# Patient Record
Sex: Female | Born: 1990 | Race: White | Hispanic: No | Marital: Single | Smoking: Former smoker
Health system: Southern US, Community
[De-identification: ages and names within clinical notes are randomized; demographics above are authoritative.]

## PROBLEM LIST (undated history)

## (undated) DIAGNOSIS — K219 Gastro-esophageal reflux disease without esophagitis: Secondary | ICD-10-CM

## (undated) DIAGNOSIS — K589 Irritable bowel syndrome without diarrhea: Secondary | ICD-10-CM

## (undated) DIAGNOSIS — T7840XA Allergy, unspecified, initial encounter: Secondary | ICD-10-CM

## (undated) DIAGNOSIS — B977 Papillomavirus as the cause of diseases classified elsewhere: Secondary | ICD-10-CM

## (undated) DIAGNOSIS — F1910 Other psychoactive substance abuse, uncomplicated: Secondary | ICD-10-CM

## (undated) DIAGNOSIS — G5600 Carpal tunnel syndrome, unspecified upper limb: Secondary | ICD-10-CM

## (undated) DIAGNOSIS — F329 Major depressive disorder, single episode, unspecified: Secondary | ICD-10-CM

## (undated) DIAGNOSIS — F32A Depression, unspecified: Secondary | ICD-10-CM

## (undated) DIAGNOSIS — Z9889 Other specified postprocedural states: Secondary | ICD-10-CM

## (undated) DIAGNOSIS — D649 Anemia, unspecified: Secondary | ICD-10-CM

## (undated) DIAGNOSIS — R112 Nausea with vomiting, unspecified: Secondary | ICD-10-CM

## (undated) DIAGNOSIS — F419 Anxiety disorder, unspecified: Secondary | ICD-10-CM

## (undated) HISTORY — DX: Allergy, unspecified, initial encounter: T78.40XA

## (undated) HISTORY — DX: Other psychoactive substance abuse, uncomplicated: F19.10

## (undated) HISTORY — DX: Gastro-esophageal reflux disease without esophagitis: K21.9

## (undated) HISTORY — PX: TONSILLECTOMY: SUR1361

---
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
## 2009-06-10 ENCOUNTER — Encounter: Admission: RE | Admit: 2009-06-10 | Discharge: 2009-09-03 | Payer: Self-pay | Admitting: Orthopedic Surgery

## 2009-08-31 ENCOUNTER — Encounter: Admission: RE | Admit: 2009-08-31 | Discharge: 2009-08-31 | Payer: Self-pay | Admitting: Family Medicine

## 2013-02-28 ENCOUNTER — Ambulatory Visit (INDEPENDENT_AMBULATORY_CARE_PROVIDER_SITE_OTHER): Payer: Managed Care, Other (non HMO) | Admitting: Physician Assistant

## 2013-02-28 VITALS — BP 120/70 | HR 76 | Temp 97.8°F | Ht 63.0 in | Wt 140.0 lb

## 2013-02-28 DIAGNOSIS — J322 Chronic ethmoidal sinusitis: Secondary | ICD-10-CM

## 2013-02-28 MED ORDER — AMOXICILLIN 500 MG PO CAPS: 500.0000 mg | ORAL_CAPSULE | Freq: Three times a day (TID) | ORAL | Status: DC

## 2013-03-01 NOTE — Progress Notes (Signed)
  Subjective:    Patient ID: Jean Campbell, female    DOB: 1991-05-12, 22 y.o.   MRN: [id number]  HPI Sinus pain and pressure x 2 days    Review of Systems  Constitutional: Positive for chills.  HENT: Positive for congestion, rhinorrhea, sneezing, postnasal drip and sinus pressure.   All other systems reviewed and are negative.       Objective:   Physical Exam  Vitals reviewed. Constitutional: She appears well-developed and well-nourished.  HENT:  Head: Normocephalic and atraumatic.  Right Ear: External ear normal.  Left Ear: External ear normal.  maxofacial tenderness, nasal hypertrophy  Eyes: Conjunctivae and EOM are normal. Pupils are equal, round, and reactive to light.  Neck: Normal range of motion. Neck supple.  Cardiovascular: Normal rate, regular rhythm and normal heart sounds.   Pulmonary/Chest: Effort normal and breath sounds normal.          Assessment & Plan:  Ethmoid sinusitis - Plan: amoxicillin (AMOXIL) 500 MG capsule

## 2013-04-02 ENCOUNTER — Encounter: Payer: Self-pay | Admitting: Family Medicine

## 2013-04-02 ENCOUNTER — Ambulatory Visit (INDEPENDENT_AMBULATORY_CARE_PROVIDER_SITE_OTHER): Payer: Managed Care, Other (non HMO) | Admitting: Family Medicine

## 2013-04-02 VITALS — BP 111/61 | HR 67 | Temp 97.0°F | Ht 63.0 in | Wt 156.0 lb

## 2013-04-02 DIAGNOSIS — L259 Unspecified contact dermatitis, unspecified cause: Secondary | ICD-10-CM

## 2013-04-02 MED ORDER — METHYLPREDNISOLONE ACETATE 80 MG/ML IJ SUSP
60.0000 mg | Freq: Once | INTRAMUSCULAR | Status: AC
  Administered 2013-04-02: 60 mg via INTRAMUSCULAR

## 2013-04-02 MED ORDER — PREDNISONE 10 MG PO TABS: ORAL_TABLET | ORAL | Status: DC

## 2013-04-02 NOTE — Progress Notes (Signed)
Subjective:    Patient ID: Jean Campbell, female    DOB: [birth year], 22 y.o.   MRN: [id number]  HPI Working in yard 3 days ago. Has developed contact dermatitis involving the face arms legs and abdomen. Says that she is very allergic to this and seems to spread rapidly.   Review of Systems  Skin: Positive for rash (itchy ,face, arms, legs, stomach).       Objective:   Physical Exam Positive for rash and multiple areas of her body right neck right arm right leg breast area and abdomen. Also has rash anterior to the left ear.      Assessment & Plan:  1. Contact dermatitis - methylPREDNISolone acetate (DEPO-MEDROL) injection 60 mg; Inject 0.75 mLs (60 mg total) into the muscle once. - predniSONE (DELTASONE) 10 MG tablet; 1 tablet 4 times daily for 3 day 1 tablet 3 times daily for 3 day 1 tablet 2 times daily for 3 day 1 tablet daily for 3 day  Dispense: 30 tablet; Refill: 0   Patient Instructions  Use Benadryl as needed for itching especially at nighttime Take meds as directed       Poison [facility] is an inflammation of the skin (contact dermatitis). It is caused by contact with the allergens on the leaves of the oak (toxicodendron) plants. Depending on your sensitivity, the rash may consist simply of redness and itching, or it may also progress to blisters which may break open (rupture). These must be well cared for to prevent secondary germ (bacterial) infection as these infections can lead to scarring. The eyes may also get puffy. The puffiness is worst in the morning and gets better as the day progresses. Healing is best accomplished by keeping any open areas dry, clean, covered with a bandage, and covered with an antibacterial ointment if needed. Without secondary infection, this dermatitis usually heals without scarring within 2 to 3 weeks without treatment. HOME CARE INSTRUCTIONS When you have been exposed to poison oak, it is very important to thoroughly wash with  soap and water as soon as the exposure has been discovered. You have about one half hour to remove the plant resin before it will cause the rash. This cleaning will quickly destroy the oil or antigen on the skin (the antigen is what causes the rash). Wash aggressively under the fingernails as any plant resin still there will continue to spread the rash. Do not rub skin vigorously when washing affected area. Poison oak cannot spread if no oil from the plant remains on your body. Rash that has progressed to weeping sores (lesions) will not spread the rash unless you have not washed thoroughly. It is also important to clean any clothes you have been wearing as they may carry active allergens which will spread the rash, even several days later. Avoidance of the plant in the future is the best measure. Poison oak plants can be recognized by the number of leaves. Generally, poison oak has three leaves with flowering branches on a single stem. Diphenhydramine may be purchased over the counter and used as needed for itching. Do not drive with this medication if it makes you drowsy. Ask your caregiver about medication for children. SEEK IMMEDIATE MEDICAL CARE IF:   Open areas of the rash develop.  You notice redness extending beyond the area of the rash.  There is a pus like discharge.  There is increased pain.  Other signs of infection develop (such as fever). Document Released: 05/28/2003 Document Revised:  02/13/2012 Document Reviewed: 10/07/2009 ExitCare Patient Information 2013 [place], [place].

## 2013-04-02 NOTE — Patient Instructions (Addendum)
Use Benadryl as needed for itching especially at nighttime Take meds as directed       Poison [facility] is an inflammation of the skin (contact dermatitis). It is caused by contact with the allergens on the leaves of the oak (toxicodendron) plants. Depending on your sensitivity, the rash may consist simply of redness and itching, or it may also progress to blisters which may break open (rupture). These must be well cared for to prevent secondary germ (bacterial) infection as these infections can lead to scarring. The eyes may also get puffy. The puffiness is worst in the morning and gets better as the day progresses. Healing is best accomplished by keeping any open areas dry, clean, covered with a bandage, and covered with an antibacterial ointment if needed. Without secondary infection, this dermatitis usually heals without scarring within 2 to 3 weeks without treatment. HOME CARE INSTRUCTIONS When you have been exposed to poison oak, it is very important to thoroughly wash with soap and water as soon as the exposure has been discovered. You have about one half hour to remove the plant resin before it will cause the rash. This cleaning will quickly destroy the oil or antigen on the skin (the antigen is what causes the rash). Wash aggressively under the fingernails as any plant resin still there will continue to spread the rash. Do not rub skin vigorously when washing affected area. Poison oak cannot spread if no oil from the plant remains on your body. Rash that has progressed to weeping sores (lesions) will not spread the rash unless you have not washed thoroughly. It is also important to clean any clothes you have been wearing as they may carry active allergens which will spread the rash, even several days later. Avoidance of the plant in the future is the best measure. Poison oak plants can be recognized by the number of leaves. Generally, poison oak has three leaves with flowering branches on a  single stem. Diphenhydramine may be purchased over the counter and used as needed for itching. Do not drive with this medication if it makes you drowsy. Ask your caregiver about medication for children. SEEK IMMEDIATE MEDICAL CARE IF:   Open areas of the rash develop.  You notice redness extending beyond the area of the rash.  There is a pus like discharge.  There is increased pain.  Other signs of infection develop (such as fever). Document Released: 05/28/2003 Document Revised: 02/13/2012 Document Reviewed: 10/07/2009 [facility] Patient Information 2013 [place], [place].

## 2013-05-14 ENCOUNTER — Encounter: Payer: Self-pay | Admitting: Family Medicine

## 2013-05-14 ENCOUNTER — Telehealth: Payer: Self-pay | Admitting: Family Medicine

## 2013-05-14 ENCOUNTER — Ambulatory Visit (INDEPENDENT_AMBULATORY_CARE_PROVIDER_SITE_OTHER): Payer: Managed Care, Other (non HMO) | Admitting: Family Medicine

## 2013-05-14 VITALS — BP 106/75 | HR 71 | Temp 97.9°F | Ht 63.0 in | Wt 154.8 lb

## 2013-05-14 DIAGNOSIS — R5383 Other fatigue: Secondary | ICD-10-CM

## 2013-05-14 DIAGNOSIS — R231 Pallor: Secondary | ICD-10-CM

## 2013-05-14 DIAGNOSIS — R10819 Abdominal tenderness, unspecified site: Secondary | ICD-10-CM

## 2013-05-14 LAB — POCT CBC
Hemoglobin: 13.3 g/dL (ref 12.2–16.2)
MCH, POC: 29.6 pg (ref 27–31.2)
MCHC: 34 g/dL (ref 31.8–35.4)
MPV: 8.6 fL (ref 0–99.8)
POC LYMPH PERCENT: 37.8 %L (ref 10–50)

## 2013-05-14 LAB — POCT URINALYSIS DIPSTICK
Ketones, UA: NEGATIVE
Leukocytes, UA: NEGATIVE
Nitrite, UA: NEGATIVE
pH, UA: 5

## 2013-05-14 LAB — HEPATIC FUNCTION PANEL
AST: 14 U/L (ref 0–37)
Alkaline Phosphatase: 51 U/L (ref 39–117)
Bilirubin, Direct: <0.1 mg/dL (ref 0.0–0.3)
Total Bilirubin: 0.2 mg/dL — ABNORMAL LOW (ref 0.3–1.2)

## 2013-05-14 LAB — THYROID PANEL WITH TSH: TSH: 4.149 u[IU]/mL (ref 0.350–4.500)

## 2013-05-14 LAB — POCT UA - MICROSCOPIC ONLY
Casts, Ur, LPF, POC: NEGATIVE
Yeast, UA: NEGATIVE

## 2013-05-14 NOTE — Progress Notes (Signed)
  Subjective:    Patient ID: Jean Campbell, female    DOB: August 04, 1991, 22 y.o.   MRN: [id number]  HPI Weakness fatigue and change in skin color  Review of Systems  Constitutional: Positive for fatigue (increased over the last 3 weeks).  HENT:       Headache periodically. She wears a headset in her job and she attributes the headache to using the headset  Respiratory: Positive for cough.   Cardiovascular: Negative.  Negative for chest pain.  Gastrointestinal: Positive for abdominal pain.       Generalized abdominal pain  Genitourinary: Positive for frequency and menstrual problem. Negative for dysuria.       She also has heavy periods and she taking a generic Ortho Tri-Cyclen from her gynecologist in [facility]  Musculoskeletal: Positive for back pain (LBP) and arthralgias (bilateral legs).  Neurological: Positive for headaches (constant).       Objective:   Physical Exam  Vitals reviewed. Constitutional: She is oriented to person, place, and time. She appears well-developed and well-nourished. No distress.  HENT:  Head: Normocephalic and atraumatic.  Right Ear: External ear normal.  Left Ear: External ear normal.  Mouth/Throat: Oropharynx is clear and moist.  Nasal turbinate congestion bilaterally with swelling  Eyes: Conjunctivae are normal. Pupils are equal, round, and reactive to light. Right eye exhibits no discharge. Left eye exhibits no discharge. No scleral icterus.  Neck: Normal range of motion. Neck supple. No thyromegaly present.  Cardiovascular: Normal rate, regular rhythm and normal heart sounds.  Exam reveals no gallop and no friction rub.   No murmur heard. Pulmonary/Chest: Effort normal and breath sounds normal. No respiratory distress. She has no wheezes. She has no rales. She exhibits no tenderness.  Abdominal: Soft. She exhibits no distension and no mass. There is tenderness (generally tender especially in the suprapubic area). There is no rebound and no  guarding.  Musculoskeletal: Normal range of motion. She exhibits no edema.  Lymphadenopathy:    She has no cervical adenopathy.  Neurological: She is alert and oriented to person, place, and time. She has normal reflexes.  Skin: Skin is warm and dry. No rash noted. She is not diaphoretic. No erythema. There is pallor (may be some slight pallor).  Psychiatric: Her behavior is normal. Judgment and thought content normal.  Slightly depressed affect          Assessment & Plan:  1. Abdominal tenderness - POCT urinalysis dipstick - POCT UA - Microscopic Only  2. Fatigue - POCT CBC - Thyroid Panel With TSH - BASIC METABOLIC PANEL WITH GFR - Hepatic function panel  3. Skin pallor  Patient Instructions  Please make an appointment with gynecologist to discuss birth control pill which will not cause so much heavy bleeding We will call with labs once they are available

## 2013-05-14 NOTE — Patient Instructions (Addendum)
Please make an appointment with gynecologist to discuss birth control pill which will not cause so much heavy bleeding We will call with labs once they are available

## 2013-05-15 LAB — BASIC METABOLIC PANEL WITH GFR
CO2: 25 mEq/L (ref 19–32)
Chloride: 105 mEq/L (ref 96–112)
Creat: 0.71 mg/dL (ref 0.50–1.10)
Potassium: 4.5 mEq/L (ref 3.5–5.3)
Sodium: 137 mEq/L (ref 135–145)

## 2013-05-16 ENCOUNTER — Telehealth: Payer: Self-pay | Admitting: *Deleted

## 2013-05-16 NOTE — Telephone Encounter (Signed)
Pt notified of results

## 2013-05-16 NOTE — Telephone Encounter (Signed)
Message copied by Bearl Mulberry on Thu May 16, 2013 10:34 AM ------      Message from: Ernestina Penna      Created: Tue May 14, 2013  1:08 PM       Patient has a normal CBC with a white blood cell count 9.9 and hemoglobin 13.3. Platelet count was adequate.      Urinalysis had 1-5 WBC-----is she having voiding symptoms?????----- if none , no treatment ------

## 2014-02-17 ENCOUNTER — Encounter: Payer: Self-pay | Admitting: Family Medicine

## 2014-02-17 ENCOUNTER — Ambulatory Visit (INDEPENDENT_AMBULATORY_CARE_PROVIDER_SITE_OTHER): Payer: Managed Care, Other (non HMO) | Admitting: Family Medicine

## 2014-02-17 VITALS — BP 125/60 | HR 67 | Temp 98.6°F | Ht 63.0 in | Wt 156.4 lb

## 2014-02-17 DIAGNOSIS — L255 Unspecified contact dermatitis due to plants, except food: Secondary | ICD-10-CM

## 2014-02-17 DIAGNOSIS — L247 Irritant contact dermatitis due to plants, except food: Secondary | ICD-10-CM

## 2014-02-17 MED ORDER — PREDNISONE 20 MG PO TABS: 60.0000 mg | ORAL_TABLET | Freq: Every day | ORAL | Status: DC

## 2014-02-17 MED ORDER — METHYLPREDNISOLONE ACETATE 80 MG/ML IJ SUSP
80.0000 mg | Freq: Once | INTRAMUSCULAR | Status: AC
  Administered 2014-02-17: 80 mg via INTRAMUSCULAR
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
## 2014-02-17 NOTE — Patient Instructions (Signed)
Poison Oak Poison oak is an inflammation of the skin (contact dermatitis). It is caused by contact with the allergens on the leaves of the oak (toxicodendron) plants. Depending on your sensitivity, the rash may consist simply of redness and itching, or it may also progress to blisters which may break open (rupture). These must be well cared for to prevent secondary germ (bacterial) infection as these infections can lead to scarring. The eyes may also get puffy. The puffiness is worst in the morning and gets better as the day progresses. Healing is best accomplished by keeping any open areas dry, clean, covered with a bandage, and covered with an antibacterial ointment if needed. Without secondary infection, this dermatitis usually heals without scarring within 2 to 3 weeks without treatment. HOME CARE INSTRUCTIONS When you have been exposed to poison oak, it is very important to thoroughly wash with soap and water as soon as the exposure has been discovered. You have about one half hour to remove the plant resin before it will cause the rash. This cleaning will quickly destroy the oil or antigen on the skin (the antigen is what causes the rash). Wash aggressively under the fingernails as any plant resin still there will continue to spread the rash. Do not rub skin vigorously when washing affected area. Poison oak cannot spread if no oil from the plant remains on your body. Rash that has progressed to weeping sores (lesions) will not spread the rash unless you have not washed thoroughly. It is also important to clean any clothes you have been wearing as they may carry active allergens which will spread the rash, even several days later. Avoidance of the plant in the future is the best measure. Poison oak plants can be recognized by the number of leaves. Generally, poison oak has three leaves with flowering branches on a single stem. Diphenhydramine may be purchased over the counter and used as needed for  itching. Do not drive with this medication if it makes you drowsy. Ask your caregiver about medication for children. SEEK IMMEDIATE MEDICAL CARE IF:   Open areas of the rash develop.  You notice redness extending beyond the area of the rash.  There is a pus like discharge.  There is increased pain.  Other signs of infection develop (such as fever). Document Released: 05/28/2003 Document Revised: 02/13/2012 Document Reviewed: 10/07/2009 [employer] Patient Information 2014 [employer].  

## 2014-02-28 ENCOUNTER — Telehealth: Payer: Self-pay | Admitting: Family Medicine

## 2014-02-28 ENCOUNTER — Encounter: Payer: Self-pay | Admitting: Family Medicine

## 2014-02-28 ENCOUNTER — Ambulatory Visit (INDEPENDENT_AMBULATORY_CARE_PROVIDER_SITE_OTHER): Payer: Managed Care, Other (non HMO) | Admitting: Family Medicine

## 2014-02-28 VITALS — BP 118/80 | HR 98 | Temp 97.6°F | Ht 63.0 in | Wt 158.4 lb

## 2014-02-28 DIAGNOSIS — M25569 Pain in unspecified knee: Secondary | ICD-10-CM

## 2014-02-28 NOTE — Telephone Encounter (Signed)
appt given for today at 4:30 with bill

## 2014-02-28 NOTE — Progress Notes (Signed)
   Subjective:    Patient ID: Jean Campbell, female    DOB: 04/02/1991, 23 y.o.   MRN: [id number]  HPI This 23 y.o. female presents for evaluation of knee pain. She has had knee Pain and she has had an injection and it has worked well.  She states she Wants another injection.  She has injured her knee playing soccer in the past..   Review of Systems C/o left knee pain No chest pain, SOB, HA, dizziness, vision change, N/V, diarrhea, constipation, dysuria, urinary urgency or frequency, myalgias, arthralgias or rash.     Objective:   Physical Exam  Vital signs noted  Well developed well nourished female.  HEENT - Head atraumatic Normocephalic                Eyes - PERRLA, Conjuctiva - clear Sclera- Clear EOMI                Ears - EAC's Wnl TM's Wnl Gross Hearing WNL                Throat - oropharanx wnl Respiratory - Lungs CTA bilateral Cardiac - RRR S1 and S2 without murmur GI - Abdomen soft Nontender and bowel sounds active x 4 MS - TTP left knee, negative drawer, TTP medial aspect of knee   Procedure - Left knee prepped with ETOH pad and then knee injected medially under patella And 1 cc of lidocaine, 1 cc of marcaine, and 1 cc of kenalog injected into left knee space.     Assessment & Plan:  Knee pain Knee injected with lidocaine, marcaine, and kenalog.  Recommend at some point  She get an orthopedic referral.  Deatra CanterWilliam J Taydon Nasworthy FNP

## 2014-05-28 ENCOUNTER — Encounter: Payer: Self-pay | Admitting: Physician Assistant

## 2014-05-28 ENCOUNTER — Encounter (INDEPENDENT_AMBULATORY_CARE_PROVIDER_SITE_OTHER): Payer: Self-pay

## 2014-05-28 ENCOUNTER — Ambulatory Visit (INDEPENDENT_AMBULATORY_CARE_PROVIDER_SITE_OTHER): Payer: Managed Care, Other (non HMO) | Admitting: Physician Assistant

## 2014-05-28 VITALS — BP 116/70 | Temp 99.5°F | Ht 63.0 in | Wt 154.4 lb

## 2014-05-28 DIAGNOSIS — H109 Unspecified conjunctivitis: Secondary | ICD-10-CM

## 2014-05-28 MED ORDER — TOBRAMYCIN 0.3 % OP SOLN: 2.0000 [drp] | Freq: Four times a day (QID) | OPHTHALMIC | Status: DC
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
## 2014-05-28 NOTE — Patient Instructions (Signed)

## 2014-07-29 ENCOUNTER — Ambulatory Visit (INDEPENDENT_AMBULATORY_CARE_PROVIDER_SITE_OTHER): Payer: Managed Care, Other (non HMO) | Admitting: Family Medicine

## 2014-07-29 ENCOUNTER — Ambulatory Visit (INDEPENDENT_AMBULATORY_CARE_PROVIDER_SITE_OTHER): Payer: Managed Care, Other (non HMO)

## 2014-07-29 ENCOUNTER — Encounter: Payer: Self-pay | Admitting: Family Medicine

## 2014-07-29 DIAGNOSIS — M545 Low back pain, unspecified: Secondary | ICD-10-CM

## 2014-07-29 DIAGNOSIS — M25569 Pain in unspecified knee: Secondary | ICD-10-CM

## 2014-07-29 DIAGNOSIS — M79609 Pain in unspecified limb: Secondary | ICD-10-CM

## 2014-07-29 DIAGNOSIS — M549 Dorsalgia, unspecified: Secondary | ICD-10-CM

## 2014-07-29 MED ORDER — NAPROXEN 500 MG PO TABS
500.0000 mg | ORAL_TABLET | Freq: Two times a day (BID) | ORAL | Status: DC
Start: 2014-07-29 — End: 2014-08-10

## 2014-07-29 MED ORDER — CYCLOBENZAPRINE HCL 10 MG PO TABS: 10.0000 mg | ORAL_TABLET | Freq: Three times a day (TID) | ORAL | Status: DC | PRN
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
## 2014-08-10 ENCOUNTER — Other Ambulatory Visit: Payer: Self-pay | Admitting: Family Medicine

## 2014-09-18 ENCOUNTER — Ambulatory Visit (INDEPENDENT_AMBULATORY_CARE_PROVIDER_SITE_OTHER): Payer: Managed Care, Other (non HMO) | Admitting: Family Medicine

## 2014-09-18 VITALS — BP 114/73 | HR 86 | Temp 98.3°F | Ht 63.0 in | Wt 158.0 lb

## 2014-09-18 DIAGNOSIS — J206 Acute bronchitis due to rhinovirus: Secondary | ICD-10-CM

## 2014-09-18 MED ORDER — BENZONATATE 100 MG PO CAPS: 100.0000 mg | ORAL_CAPSULE | Freq: Three times a day (TID) | ORAL | Status: DC | PRN

## 2014-09-18 MED ORDER — AMOXICILLIN 875 MG PO TABS: 875.0000 mg | ORAL_TABLET | Freq: Two times a day (BID) | ORAL | Status: DC

## 2014-09-18 MED ORDER — FLUCONAZOLE 150 MG PO TABS
150.0000 mg | ORAL_TABLET | Freq: Once | ORAL | Status: DC
Start: 2014-09-18 — End: 2016-07-11

## 2014-09-18 NOTE — Progress Notes (Signed)
   Subjective:    Patient ID: Jean Campbell, female    DOB: 08/05/1991, 23 y.o.   MRN: [id number]  HPI This 23 y.o. female presents for evaluation of URI and cough.   Review of Systems    No chest pain, SOB, HA, dizziness, vision change, N/V, diarrhea, constipation, dysuria, urinary urgency or frequency, myalgias, arthralgias or rash.  Objective:   Physical Exam  Vital signs noted  Well developed well nourished female.  HEENT - Head atraumatic Normocephalic                Eyes - PERRLA, Conjuctiva - clear Sclera- Clear EOMI                Ears - EAC's Wnl TM's Wnl Gross Hearing WNL                Nose - Nares patent                 Throat - oropharanx wnl Respiratory - Lungs CTA bilateral Cardiac - RRR S1 and S2 without murmur GI - Abdomen soft Nontender and bowel sounds active x 4 Extremities - No edema. Neuro - Grossly intact.      Assessment & Plan:  Acute bronchitis due to Rhinovirus - Plan: amoxicillin (AMOXIL) 875 MG tablet, fluconazole (DIFLUCAN) 150 MG tablet, benzonatate (TESSALON PERLES) 100 MG capsule  Push po fluids, rest, tylenol and motrin otc prn as directed for fever, arthralgias, and myalgias.  Follow up prn if sx's continue or persist.  Deatra CanterWilliam J Mykah Bellomo FNP

## 2014-12-09 ENCOUNTER — Telehealth: Payer: Self-pay | Admitting: Family Medicine

## 2014-12-09 ENCOUNTER — Telehealth: Payer: Self-pay | Admitting: *Deleted

## 2014-12-09 MED ORDER — FLUCONAZOLE 150 MG PO TABS: 150.0000 mg | ORAL_TABLET | Freq: Once | ORAL | Status: DC

## 2014-12-09 NOTE — Telephone Encounter (Signed)
Script for diflucan sent to pharmacy

## 2014-12-09 NOTE — Telephone Encounter (Signed)
Diflucan sent to pharamcy.

## 2016-01-06 ENCOUNTER — Telehealth: Payer: Self-pay | Admitting: Family Medicine

## 2016-01-07 NOTE — Telephone Encounter (Signed)
Pt aware that she must be seen

## 2016-01-11 ENCOUNTER — Other Ambulatory Visit: Payer: Self-pay | Admitting: Nurse Practitioner

## 2016-07-11 ENCOUNTER — Encounter (INDEPENDENT_AMBULATORY_CARE_PROVIDER_SITE_OTHER): Payer: Self-pay

## 2016-07-11 ENCOUNTER — Encounter: Payer: Self-pay | Admitting: Family

## 2016-07-11 ENCOUNTER — Ambulatory Visit (INDEPENDENT_AMBULATORY_CARE_PROVIDER_SITE_OTHER): Payer: Managed Care, Other (non HMO) | Admitting: Family

## 2016-07-11 DIAGNOSIS — F411 Generalized anxiety disorder: Secondary | ICD-10-CM

## 2016-07-11 DIAGNOSIS — F329 Major depressive disorder, single episode, unspecified: Secondary | ICD-10-CM

## 2016-07-11 DIAGNOSIS — F32A Depression, unspecified: Secondary | ICD-10-CM | POA: Insufficient documentation

## 2016-07-11 DIAGNOSIS — F339 Major depressive disorder, recurrent, unspecified: Secondary | ICD-10-CM | POA: Insufficient documentation

## 2016-07-11 MED ORDER — CITALOPRAM HYDROBROMIDE 20 MG PO TABS: 20.0000 mg | ORAL_TABLET | Freq: Every day | ORAL | 5 refills | Status: DC

## 2016-07-11 NOTE — Progress Notes (Signed)
   Subjective:    Patient ID: Jean Campbell, female    DOB: 06/23/1991, [age] y.o.   MRN: [id number]  Pt presents to the office today with mood swings. Pt states over the last few months she has felt anxious, tearful, emotional, and does not want to be around people.  Depression         This is a new problem.  The current episode started more than 1 month ago.   The onset quality is gradual.   The problem occurs constantly.  The problem has been waxing and waning since onset.  Associated symptoms include decreased concentration, helplessness, hopelessness, irritable, restlessness, decreased interest and sad.  Associated symptoms include no suicidal ideas.     The symptoms are aggravated by family issues.  Past treatments include nothing.  Past medical history includes anxiety.   Anxiety  Presents for follow-up visit. Symptoms include decreased concentration, depressed mood, excessive worry, irritability, nervous/anxious behavior and restlessness. Patient reports no nausea or suicidal ideas. Symptoms occur most days. The severity of symptoms is mild. The quality of sleep is fair.        Review of Systems  Constitutional: Positive for irritability.  Gastrointestinal: Negative for nausea.  Psychiatric/Behavioral: Positive for decreased concentration and depression. Negative for self-injury and suicidal ideas. The patient is nervous/anxious.   All other systems reviewed and are negative.      Objective:   Physical Exam  Constitutional: She is oriented to person, place, and time. She appears well-developed and well-nourished. She is irritable. No distress.  HENT:  Head: Normocephalic and atraumatic.  Cardiovascular: Normal rate, regular rhythm, normal heart sounds and intact distal pulses.   No murmur heard. Pulmonary/Chest: Effort normal and breath sounds normal. No respiratory distress. She has no wheezes.  Abdominal: Soft. Bowel sounds are normal. She exhibits no distension. There is no  tenderness.  Musculoskeletal: Normal range of motion. She exhibits no edema or tenderness.  Neurological: She is alert and oriented to person, place, and time.  Skin: Skin is warm and dry.  Psychiatric: She has a normal mood and affect. Her behavior is normal. Judgment and thought content normal.  Vitals reviewed.     BP 120/76   Pulse 70   Temp 98 F (36.7 C) (Oral)   Ht 5\' 3"  (1.6 m)   Wt 154 lb 3.2 oz (69.9 kg)   BMI 27.32 kg/m      Assessment & Plan:  1. Depression - citalopram (CELEXA) 20 MG tablet; Take 1 tablet (20 mg total) by mouth daily.  Dispense: 30 tablet; Refill: 5  2. GAD (generalized anxiety disorder) - citalopram (CELEXA) 20 MG tablet; Take 1 tablet (20 mg total) by mouth daily.  Dispense: 30 tablet; Refill: 5  Pt started on Celexa 20 mg today Stress management discussed Discussed if any thoughts of harming herself or others go to ED RTO in 4 weeks  Jannifer Rodneyhristy Francheska Villeda, FNP

## 2016-07-11 NOTE — Patient Instructions (Signed)
° °Generalized Anxiety Disorder °Generalized anxiety disorder (GAD) is a mental disorder. It interferes with life functions, including relationships, work, and school. °GAD is different from normal anxiety, which everyone experiences at some point in their lives in response to specific life events and activities. Normal anxiety actually helps us prepare for and get through these life events and activities. Normal anxiety goes away after the event or activity is over.  °GAD causes anxiety that is not necessarily related to specific events or activities. It also causes excess anxiety in proportion to specific events or activities. The anxiety associated with GAD is also difficult to control. GAD can vary from mild to severe. People with severe GAD can have intense waves of anxiety with physical symptoms (panic attacks).  °SYMPTOMS °The anxiety and worry associated with GAD are difficult to control. This anxiety and worry are related to many life events and activities and also occur more days than not for 6 months or longer. People with GAD also have three or more of the following symptoms (one or more in children): °· Restlessness.   °· Fatigue. °· Difficulty concentrating.   °· Irritability. °· Muscle tension. °· Difficulty sleeping or unsatisfying sleep. °DIAGNOSIS °GAD is diagnosed through an assessment by your health care provider. Your health care provider will ask you questions about your mood, physical symptoms, and events in your life. Your health care provider may ask you about your medical history and use of alcohol or drugs, including prescription medicines. Your health care provider may also do a physical exam and blood tests. Certain medical conditions and the use of certain substances can cause symptoms similar to those associated with GAD. Your health care provider may refer you to a mental health specialist for further evaluation. °TREATMENT °The following therapies are usually used to treat GAD:   °· Medication. Antidepressant medication usually is prescribed for long-term daily control. Antianxiety medicines may be added in severe cases, especially when panic attacks occur.   °· Talk therapy (psychotherapy). Certain types of talk therapy can be helpful in treating GAD by providing support, education, and guidance. A form of talk therapy called cognitive behavioral therapy can teach you healthy ways to think about and react to daily life events and activities. °· Stress management techniques. These include yoga, meditation, and exercise and can be very helpful when they are practiced regularly. °A mental health specialist can help determine which treatment is best for you. Some people see improvement with one therapy. However, other people require a combination of therapies. °  °This information is not intended to replace advice given to you by your health care provider. Make sure you discuss any questions you have with your health care provider. °  °Document Released: 03/18/2013 Document Revised: 12/12/2014 Document Reviewed: 03/18/2013 °Elsevier Interactive Patient Education ©2016 [employer] ° °Major Depressive Disorder °Major depressive disorder is a mental illness. It also may be called clinical depression or unipolar depression. Major depressive disorder usually causes feelings of sadness, hopelessness, or helplessness. Some people with this disorder do not feel particularly sad but lose interest in doing things they used to enjoy (anhedonia). Major depressive disorder also can cause physical symptoms. It can interfere with work, school, relationships, and other normal everyday activities. The disorder varies in severity but is longer lasting and more serious than the sadness we all feel from time to time in our lives. °Major depressive disorder often is triggered by stressful life events or major life changes. Examples of these triggers include divorce, loss of your job or home,   a move, and the  death of a family member or close friend. Sometimes this disorder occurs for no obvious reason at all. People who have family members with major depressive disorder or bipolar disorder are at higher risk for developing this disorder, with or without life stressors. Major depressive disorder can occur at any age. It may occur just once in your life (single episode major depressive disorder). It may occur multiple times (recurrent major depressive disorder). °SYMPTOMS °People with major depressive disorder have either anhedonia or depressed mood on nearly a daily basis for at least 2 weeks or longer. Symptoms of depressed mood include: °· Feelings of sadness (blue or down in the dumps) or emptiness. °· Feelings of hopelessness or helplessness. °· Tearfulness or episodes of crying (may be observed by others). °· Irritability (children and adolescents). °In addition to depressed mood or anhedonia or both, people with this disorder have at least four of the following symptoms: °· Difficulty sleeping or sleeping too much.   °· Significant change (increase or decrease) in appetite or weight.   °· Lack of energy or motivation. °· Feelings of guilt and worthlessness.   °· Difficulty concentrating, remembering, or making decisions. °· Unusually slow movement (psychomotor retardation) or restlessness (as observed by others).   °· Recurrent wishes for death, recurrent thoughts of self-harm (suicide), or a suicide attempt. °People with major depressive disorder commonly have persistent negative thoughts about themselves, other people, and the world. People with severe major depressive disorder may experience distorted beliefs or perceptions about the world (psychotic delusions). They also may see or hear things that are not real (psychotic hallucinations). °DIAGNOSIS °Major depressive disorder is diagnosed through an assessment by your health care provider. Your health care provider will ask about aspects of your daily life,  such as mood, sleep, and appetite, to see if you have the diagnostic symptoms of major depressive disorder. Your health care provider may ask about your medical history and use of alcohol or drugs, including prescription medicines. Your health care provider also may do a physical exam and blood work. This is because certain medical conditions and the use of certain substances can cause major depressive disorder-like symptoms (secondary depression). Your health care provider also may refer you to a mental health specialist for further evaluation and treatment. °TREATMENT °It is important to recognize the symptoms of major depressive disorder and seek treatment. The following treatments can be prescribed for this disorder:   °· Medicine. Antidepressant medicines usually are prescribed. Antidepressant medicines are thought to correct chemical imbalances in the brain that are commonly associated with major depressive disorder. Other types of medicine may be added if the symptoms do not respond to antidepressant medicines alone or if psychotic delusions or hallucinations occur. °· Talk therapy. Talk therapy can be helpful in treating major depressive disorder by providing support, education, and guidance. Certain types of talk therapy also can help with negative thinking (cognitive behavioral therapy) and with relationship issues that trigger this disorder (interpersonal therapy). °A mental health specialist can help determine which treatment is best for you. Most people with major depressive disorder do well with a combination of medicine and talk therapy. Treatments involving electrical stimulation of the brain can be used in situations with extremely severe symptoms or when medicine and talk therapy do not work over time. These treatments include electroconvulsive therapy, transcranial magnetic stimulation, and vagal nerve stimulation. °  °This information is not intended to replace advice given to you by your health  care provider. Make sure you discuss any questions you have   with your health care provider. °  °Document Released: 03/18/2013 Document Revised: 12/12/2014 Document Reviewed: 03/18/2013 °Elsevier Interactive Patient Education ©2016 Elsevier Inc. ° °

## 2016-08-15 ENCOUNTER — Ambulatory Visit: Payer: Self-pay | Admitting: Family

## 2016-09-14 ENCOUNTER — Other Ambulatory Visit: Payer: Self-pay | Admitting: Nurse Practitioner

## 2016-09-14 DIAGNOSIS — J206 Acute bronchitis due to rhinovirus: Secondary | ICD-10-CM

## 2016-10-08 ENCOUNTER — Ambulatory Visit: Payer: Self-pay

## 2016-11-30 ENCOUNTER — Ambulatory Visit
Admission: RE | Admit: 2016-11-30 | Discharge: 2016-11-30 | Disposition: A | Discharge status: Home / Self Care | Payer: Self-pay | Source: Ambulatory Visit | Attending: Family Medicine | Admitting: Family Medicine

## 2016-11-30 ENCOUNTER — Other Ambulatory Visit: Payer: Self-pay | Admitting: Family Medicine

## 2016-11-30 DIAGNOSIS — R109 Unspecified abdominal pain: Secondary | ICD-10-CM

## 2016-12-12 ENCOUNTER — Ambulatory Visit: Payer: Self-pay | Admitting: General Surgery

## 2016-12-12 NOTE — H&P (Signed)
History of Present Illness  Patient words: hernia.  The patient is a 26 year old female who presents with an umbilical hernia. patient is a 26 year old female who is referred by Lamont SnowballKatelyn Stock, FNP for evaluation of an umbilical hernia. Patient states that she had some pain approximately a month ago. She states that thereafter she noticed a small bulge the inferior portion of her umbilicus. She states she is able to reduce this manually. Patient had no signs or symptoms of incarceration or strangulation. Patient states that lifting, exertion makes the area painful.  [place] works at a [employer]. She does do heavy lifting while at work, and home.   Allergies  Sulfa Antibiotics  Latex   Medication History  Ortho Tri-Cyclen (28) (0.18/0.215/0.25MG -35 MCG Tablet, Oral) Active. Medications Reconciled  Vitals  12/12/2016 9:01 AM Weight: 158.2 lb Height: 63in Body Surface Area: 1.75 m Body Mass Index: 28.02 kg/m  Temp.: 98.36F(Oral)  Pulse: 97 (Regular)  BP: 110/70 (Sitting, Left Arm, Standard)       Physical Exam  General Mental Status-Alert. General Appearance-Consistent with stated age. Hydration-Well hydrated. Voice-Normal.  Head and Neck Head-normocephalic, atraumatic with no lesions or palpable masses. Trachea-midline. Thyroid Gland Characteristics - normal size and consistency.  Chest and Lung Exam Chest and lung exam reveals -quiet, even and easy respiratory effort with no use of accessory muscles and on auscultation, normal breath sounds, no adventitious sounds and normal vocal resonance. Inspection Chest Wall - Normal. Back - normal.  Cardiovascular Cardiovascular examination reveals -normal heart sounds, regular rate and rhythm with no murmurs and normal pedal pulses bilaterally.  Abdomen Inspection Skin - Scar - no surgical scars. Hernias - Umbilical hernia - Reducible(small  0.5cm). Palpation/Percussion Normal exam - Soft, Non Tender, No Rebound tenderness, No Rigidity (guarding) and No hepatosplenomegaly. Auscultation Normal exam - Bowel sounds normal.    Assessment & Plan  UMBILICAL HERNIA WITHOUT OBSTRUCTION AND WITHOUT GANGRENE (K42.9) Impression: 26 year old female with a small primary umbilical hernia.  1. The patient like to proceed to the operating for laparoscopic umbilical hernia repair with mesh 2. All risks and benefits were discussed with the patient to generally include, but not limited to: infection, bleeding, damage to surrounding structures, acute and chronic nerve pain, and recurrence. Alternatives were offered and described. All questions were answered and the patient voiced understanding of the procedure and wishes to proceed at this point with hernia repair.

## 2016-12-27 ENCOUNTER — Other Ambulatory Visit (HOSPITAL_COMMUNITY): Payer: Self-pay | Admitting: *Deleted

## 2016-12-27 ENCOUNTER — Ambulatory Visit: Payer: Self-pay | Admitting: General Surgery

## 2016-12-27 ENCOUNTER — Encounter (HOSPITAL_COMMUNITY): Payer: Self-pay

## 2016-12-27 NOTE — Pre-Procedure Instructions (Addendum)
Jean Campbell  12/27/2016    Your procedure is scheduled on Thursday, December 29, 2016 at 7:30 AM.   [facility] at 5:30 AM.   Call this number if you have problems the morning of surgery: [phone]     Remember:  Do not eat food or drink liquids after midnight tonight.  Take these medicines the morning of surgery with A SIP OF WATER: Tylenol - prn  Do NOT smoke 24 hours prior to surgery.   Do not wear jewelry, make-up or nail polish.  Do not wear lotions, powders or perfumes.  Do not shave 48 hours prior to surgery.    Do not bring valuables to the hospital.  [facility] is not responsible for any belongings or valuables.  Contacts, dentures or bridgework may not be worn into surgery.  Leave your suitcase in the car.  After surgery it may be brought to your room.  For patients admitted to the hospital, discharge time will be determined by your treatment team.  Patients discharged the day of surgery will not be allowed to drive home.   Special instructions:  [facility] - Preparing for Surgery  Before surgery, you can play an important role.  Because skin is not sterile, your skin needs to be as free of germs as possible.  You can reduce the number of germs on you skin by washing with CHG (chlorahexidine gluconate) soap before surgery.  CHG is an antiseptic cleaner which kills germs and bonds with the skin to continue killing germs even after washing.  Please DO NOT use if you have an allergy to CHG or antibacterial soaps.  If your skin becomes reddened/irritated stop using the CHG and inform your nurse when you arrive at Short Stay.  Do not shave (including legs and underarms) for at least 48 hours prior to the first CHG shower.  You may shave your face.  Please follow these instructions carefully:   1.  Shower with CHG Soap the night before surgery and the                    morning of Surgery.  2.  If you choose to  wash your hair, wash your hair first as usual with your       normal shampoo.  3.  After you shampoo, rinse your hair and body thoroughly to remove the shampoo.  4.  Use CHG as you would any other liquid soap.  You can apply chg directly       to the skin and wash gently with scrungie or a clean washcloth.  5.  Apply the CHG Soap to your body ONLY FROM THE NECK DOWN.        Do not use on open wounds or open sores.  Avoid contact with your eyes, ears, mouth and genitals (private parts).  Wash genitals (private parts) with your normal soap.  6.  Wash thoroughly, paying special attention to the area where your surgery        will be performed.  7.  Thoroughly rinse your body with warm water from the neck down.  8.  DO NOT shower/wash with your normal soap after using and rinsing off       the CHG Soap.  9.  Pat yourself dry with a clean towel.            10.  Wear clean pajamas.  11.  Place clean sheets on your bed the night of your first shower and do not        sleep with pets.  Day of Surgery  Do not apply any lotions the morning of surgery.  Please wear clean clothes to the hospital.   Please read over the fact sheets that you were given.

## 2016-12-28 ENCOUNTER — Encounter (HOSPITAL_COMMUNITY): Payer: Self-pay

## 2016-12-28 ENCOUNTER — Encounter (HOSPITAL_COMMUNITY)
Admission: RE | Admit: 2016-12-28 | Discharge: 2016-12-28 | Disposition: A | Discharge status: Home / Self Care | Payer: Self-pay | Source: Ambulatory Visit | Attending: General Surgery | Admitting: General Surgery

## 2016-12-28 DIAGNOSIS — Z01812 Encounter for preprocedural laboratory examination: Secondary | ICD-10-CM | POA: Insufficient documentation

## 2016-12-28 DIAGNOSIS — K429 Umbilical hernia without obstruction or gangrene: Secondary | ICD-10-CM | POA: Insufficient documentation

## 2016-12-28 HISTORY — DX: Papillomavirus as the cause of diseases classified elsewhere: B97.7

## 2016-12-28 HISTORY — DX: Major depressive disorder, single episode, unspecified: F32.9

## 2016-12-28 HISTORY — DX: Anxiety disorder, unspecified: F41.9

## 2016-12-28 HISTORY — DX: Irritable bowel syndrome, unspecified: K58.9

## 2016-12-28 HISTORY — DX: Other specified postprocedural states: Z98.890

## 2016-12-28 HISTORY — DX: Depression, unspecified: F32.A

## 2016-12-28 HISTORY — DX: Anemia, unspecified: D64.9

## 2016-12-28 HISTORY — DX: Other specified postprocedural states: R11.2

## 2016-12-28 HISTORY — DX: Carpal tunnel syndrome, unspecified upper limb: G56.00

## 2016-12-28 LAB — CBC
HEMATOCRIT: 43.4 % (ref 36.0–46.0)
HEMOGLOBIN: 15.1 g/dL — AB (ref 12.0–15.0)
MCH: 30.3 pg (ref 26.0–34.0)
MCHC: 34.8 g/dL (ref 30.0–36.0)
MCV: 87.1 fL (ref 78.0–100.0)
Platelets: 224 10*3/uL (ref 150–400)
RBC: 4.98 MIL/uL (ref 3.87–5.11)
RDW: 12.3 % (ref 11.5–15.5)
WBC: 8.3 10*3/uL (ref 4.0–10.5)

## 2016-12-28 LAB — HCG, SERUM, QUALITATIVE: Preg, Serum: NEGATIVE

## 2016-12-28 NOTE — Progress Notes (Signed)
Pt denies cardiac history, chest pain or sob. 

## 2016-12-29 ENCOUNTER — Encounter (HOSPITAL_COMMUNITY)
Admission: RE | Disposition: A | Discharge status: Home / Self Care | Payer: Self-pay | Source: Ambulatory Visit | Attending: General Surgery

## 2016-12-29 ENCOUNTER — Ambulatory Visit (HOSPITAL_COMMUNITY): Payer: Worker's Compensation | Admitting: Certified Registered Nurse Anesthetist

## 2016-12-29 ENCOUNTER — Ambulatory Visit (HOSPITAL_COMMUNITY)
Admission: RE | Admit: 2016-12-29 | Discharge: 2016-12-29 | Disposition: A | Discharge status: Home / Self Care | Payer: Worker's Compensation | Source: Ambulatory Visit | Attending: General Surgery | Admitting: General Surgery

## 2016-12-29 ENCOUNTER — Encounter (HOSPITAL_COMMUNITY): Payer: Self-pay | Admitting: *Deleted

## 2016-12-29 DIAGNOSIS — Z882 Allergy status to sulfonamides status: Secondary | ICD-10-CM | POA: Diagnosis not present

## 2016-12-29 DIAGNOSIS — F17200 Nicotine dependence, unspecified, uncomplicated: Secondary | ICD-10-CM | POA: Insufficient documentation

## 2016-12-29 DIAGNOSIS — K429 Umbilical hernia without obstruction or gangrene: Secondary | ICD-10-CM | POA: Insufficient documentation

## 2016-12-29 DIAGNOSIS — Z793 Long term (current) use of hormonal contraceptives: Secondary | ICD-10-CM | POA: Insufficient documentation

## 2016-12-29 DIAGNOSIS — Z91040 Latex allergy status: Secondary | ICD-10-CM | POA: Diagnosis not present

## 2016-12-29 HISTORY — PX: INSERTION OF MESH: SHX5868

## 2016-12-29 HISTORY — PX: UMBILICAL HERNIA REPAIR: SHX196

## 2016-12-29 SURGERY — REPAIR, HERNIA, UMBILICAL, LAPAROSCOPIC
Anesthesia: General | Site: Abdomen

## 2016-12-29 MED ORDER — BUPIVACAINE HCL (PF) 0.25 % IJ SOLN
INTRAMUSCULAR | Status: AC
  Filled 2016-12-29: qty 30

## 2016-12-29 MED ORDER — KETOROLAC TROMETHAMINE 30 MG/ML IJ SOLN
INTRAMUSCULAR | Status: AC
  Filled 2016-12-29: qty 1

## 2016-12-29 MED ORDER — 0.9 % SODIUM CHLORIDE (POUR BTL) OPTIME
TOPICAL | Status: DC | PRN
  Administered 2016-12-29: 1000 mL

## 2016-12-29 MED ORDER — GLYCOPYRROLATE 0.2 MG/ML IJ SOLN
INTRAMUSCULAR | Status: DC | PRN
  Administered 2016-12-29: 0.6 mg via INTRAVENOUS

## 2016-12-29 MED ORDER — SCOPOLAMINE 1 MG/3DAYS TD PT72
MEDICATED_PATCH | TRANSDERMAL | Status: AC
  Filled 2016-12-29: qty 1

## 2016-12-29 MED ORDER — PROPOFOL 10 MG/ML IV BOLUS
INTRAVENOUS | Status: AC
  Filled 2016-12-29: qty 20

## 2016-12-29 MED ORDER — PROMETHAZINE HCL 25 MG/ML IJ SOLN: 6.2500 mg | INTRAMUSCULAR | Status: DC | PRN

## 2016-12-29 MED ORDER — DEXAMETHASONE SODIUM PHOSPHATE 10 MG/ML IJ SOLN
INTRAMUSCULAR | Status: DC | PRN
Start: 2016-12-29 — End: 2016-12-29
  Administered 2016-12-29: 10 mg via INTRAVENOUS

## 2016-12-29 MED ORDER — NEOSTIGMINE METHYLSULFATE 10 MG/10ML IV SOLN
INTRAVENOUS | Status: DC | PRN
  Administered 2016-12-29: 4 mg via INTRAVENOUS

## 2016-12-29 MED ORDER — KETOROLAC TROMETHAMINE 30 MG/ML IJ SOLN
INTRAMUSCULAR | Status: DC | PRN
  Administered 2016-12-29: 30 mg via INTRAVENOUS

## 2016-12-29 MED ORDER — CHLORHEXIDINE GLUCONATE CLOTH 2 % EX PADS: 6.0000 | MEDICATED_PAD | Freq: Once | CUTANEOUS | Status: DC

## 2016-12-29 MED ORDER — OXYCODONE HCL 5 MG PO TABS
ORAL_TABLET | ORAL | Status: AC
  Filled 2016-12-29: qty 2

## 2016-12-29 MED ORDER — FENTANYL CITRATE (PF) 100 MCG/2ML IJ SOLN
INTRAMUSCULAR | Status: DC | PRN
  Administered 2016-12-29 (×3): 50 ug via INTRAVENOUS

## 2016-12-29 MED ORDER — FENTANYL CITRATE (PF) 100 MCG/2ML IJ SOLN
INTRAMUSCULAR | Status: AC
  Filled 2016-12-29: qty 4

## 2016-12-29 MED ORDER — MIDAZOLAM HCL 2 MG/2ML IJ SOLN
INTRAMUSCULAR | Status: AC
  Filled 2016-12-29: qty 2

## 2016-12-29 MED ORDER — SCOPOLAMINE 1 MG/3DAYS TD PT72
MEDICATED_PATCH | TRANSDERMAL | Status: DC | PRN
  Administered 2016-12-29: 1 via TRANSDERMAL

## 2016-12-29 MED ORDER — CEFAZOLIN SODIUM-DEXTROSE 2-4 GM/100ML-% IV SOLN
2.0000 g | INTRAVENOUS | Status: AC
  Administered 2016-12-29: 2 g via INTRAVENOUS
  Filled 2016-12-29: qty 100

## 2016-12-29 MED ORDER — ONDANSETRON HCL 4 MG/2ML IJ SOLN
INTRAMUSCULAR | Status: DC | PRN
  Administered 2016-12-29: 4 mg via INTRAVENOUS

## 2016-12-29 MED ORDER — MIDAZOLAM HCL 5 MG/5ML IJ SOLN
INTRAMUSCULAR | Status: DC | PRN
  Administered 2016-12-29: 2 mg via INTRAVENOUS

## 2016-12-29 MED ORDER — LACTATED RINGERS IV SOLN
INTRAVENOUS | Status: DC | PRN
  Administered 2016-12-29 (×2): via INTRAVENOUS

## 2016-12-29 MED ORDER — HYDROMORPHONE HCL 1 MG/ML IJ SOLN
0.2500 mg | INTRAMUSCULAR | Status: DC | PRN
  Administered 2016-12-29: 0.5 mg via INTRAVENOUS

## 2016-12-29 MED ORDER — PROPOFOL 10 MG/ML IV BOLUS
INTRAVENOUS | Status: DC | PRN
  Administered 2016-12-29: 180 mg via INTRAVENOUS

## 2016-12-29 MED ORDER — HYDROMORPHONE HCL 1 MG/ML IJ SOLN
INTRAMUSCULAR | Status: AC
  Filled 2016-12-29: qty 0.5

## 2016-12-29 MED ORDER — ONDANSETRON HCL 4 MG/2ML IJ SOLN
INTRAMUSCULAR | Status: AC
  Filled 2016-12-29: qty 2

## 2016-12-29 MED ORDER — ROCURONIUM BROMIDE 100 MG/10ML IV SOLN
INTRAVENOUS | Status: DC | PRN
  Administered 2016-12-29: 40 mg via INTRAVENOUS

## 2016-12-29 MED ORDER — LIDOCAINE HCL (CARDIAC) 20 MG/ML IV SOLN
INTRAVENOUS | Status: DC | PRN
  Administered 2016-12-29: 100 mg via INTRAVENOUS

## 2016-12-29 MED ORDER — PHENYLEPHRINE 40 MCG/ML (10ML) SYRINGE FOR IV PUSH (FOR BLOOD PRESSURE SUPPORT)
PREFILLED_SYRINGE | INTRAVENOUS | Status: AC
  Filled 2016-12-29: qty 10

## 2016-12-29 MED ORDER — OXYCODONE-ACETAMINOPHEN 5-325 MG PO TABS: 1.0000 | ORAL_TABLET | ORAL | 0 refills | Status: DC | PRN

## 2016-12-29 MED ORDER — OXYCODONE HCL 5 MG PO TABS
5.0000 mg | ORAL_TABLET | ORAL | Status: DC | PRN
Start: 2016-12-29 — End: 2016-12-29
  Administered 2016-12-29: 10 mg via ORAL

## 2016-12-29 MED ORDER — BUPIVACAINE HCL 0.25 % IJ SOLN
INTRAMUSCULAR | Status: DC | PRN
  Administered 2016-12-29: 4 mL

## 2016-12-29 SURGICAL SUPPLY — 35 items
BENZOIN TINCTURE PRP APPL 2/3 (GAUZE/BANDAGES/DRESSINGS) ×3 IMPLANT
CHLORAPREP W/TINT 26ML (MISCELLANEOUS) ×3 IMPLANT
CLOSURE WOUND 1/2 X4 (GAUZE/BANDAGES/DRESSINGS) ×1
COVER SURGICAL LIGHT HANDLE (MISCELLANEOUS) ×3 IMPLANT
DEVICE SECURE STRAP 25 ABSORB (INSTRUMENTS) ×3 IMPLANT
DEVICE TROCAR PUNCTURE CLOSURE (ENDOMECHANICALS) ×3 IMPLANT
ELECT REM PT RETURN 9FT ADLT (ELECTROSURGICAL) ×3
ELECTRODE REM PT RTRN 9FT ADLT (ELECTROSURGICAL) ×1 IMPLANT
GAUZE SPONGE 2X2 8PLY STRL LF (GAUZE/BANDAGES/DRESSINGS) ×1 IMPLANT
GLOVE BIO SURGEON STRL SZ7.5 (GLOVE) ×3 IMPLANT
GOWN STRL REUS W/ TWL LRG LVL3 (GOWN DISPOSABLE) ×2 IMPLANT
GOWN STRL REUS W/ TWL XL LVL3 (GOWN DISPOSABLE) ×1 IMPLANT
GOWN STRL REUS W/TWL LRG LVL3 (GOWN DISPOSABLE) ×4
GOWN STRL REUS W/TWL XL LVL3 (GOWN DISPOSABLE) ×2
KIT BASIN OR (CUSTOM PROCEDURE TRAY) ×3 IMPLANT
KIT ROOM TURNOVER OR (KITS) ×3 IMPLANT
MARKER SKIN DUAL TIP RULER LAB (MISCELLANEOUS) ×3 IMPLANT
MESH VENTRALIGHT ST 4.5IN (Mesh General) ×3 IMPLANT
NEEDLE INSUFFLATION 14GA 120MM (NEEDLE) ×3 IMPLANT
NEEDLE SPNL 22GX3.5 QUINCKE BK (NEEDLE) ×3 IMPLANT
NS IRRIG 1000ML POUR BTL (IV SOLUTION) ×3 IMPLANT
PAD ARMBOARD 7.5X6 YLW CONV (MISCELLANEOUS) ×6 IMPLANT
SCISSORS LAP 5X35 DISP (ENDOMECHANICALS) ×3 IMPLANT
SLEEVE ENDOPATH XCEL 5M (ENDOMECHANICALS) ×3 IMPLANT
SPONGE GAUZE 2X2 STER 10/PKG (GAUZE/BANDAGES/DRESSINGS) ×2
STRIP CLOSURE SKIN 1/2X4 (GAUZE/BANDAGES/DRESSINGS) ×2 IMPLANT
SUT CHROMIC 2 0 SH (SUTURE) ×3 IMPLANT
SUT MNCRL AB 4-0 PS2 18 (SUTURE) ×3 IMPLANT
SUT NOVA 1 T20/GS 25DT (SUTURE) ×3 IMPLANT
TAPE CLOTH SOFT 2X10 (GAUZE/BANDAGES/DRESSINGS) ×3 IMPLANT
TOWEL OR 17X24 6PK STRL BLUE (TOWEL DISPOSABLE) ×3 IMPLANT
TOWEL OR 17X26 10 PK STRL BLUE (TOWEL DISPOSABLE) ×3 IMPLANT
TRAY LAPAROSCOPIC MC (CUSTOM PROCEDURE TRAY) ×3 IMPLANT
TROCAR XCEL NON-BLD 5MMX100MML (ENDOMECHANICALS) ×3 IMPLANT
TUBING INSUFFLATION (TUBING) ×3 IMPLANT

## 2016-12-29 NOTE — Anesthesia Preprocedure Evaluation (Addendum)
Anesthesia Evaluation  Patient identified by MRN, date of birth, ID band Patient awake    Reviewed: Allergy & Precautions, NPO status , Patient's Chart, lab work & pertinent test results  History of Anesthesia Complications (+) PONV and history of anesthetic complications  Airway Mallampati: I  TM Distance: >3 FB Neck ROM: Full    Dental  (+) Teeth Intact   Pulmonary neg pulmonary ROS, Current Smoker,    breath sounds clear to auscultation       Cardiovascular negative cardio ROS   Rhythm:Regular Rate:Normal     Neuro/Psych negative neurological ROS  negative psych ROS   GI/Hepatic negative GI ROS, Neg liver ROS,   Endo/Other  negative endocrine ROS  Renal/GU negative Renal ROS  negative genitourinary   Musculoskeletal negative musculoskeletal ROS (+)   Abdominal   Peds negative pediatric ROS (+)  Hematology negative hematology ROS (+)   Anesthesia Other Findings   Reproductive/Obstetrics negative OB ROS                            Anesthesia Physical Anesthesia Plan  ASA: I  Anesthesia Plan: General   Post-op Pain Management:    Induction: Intravenous  Airway Management Planned: Oral ETT  Additional Equipment:   Intra-op Plan:   Post-operative Plan:   Informed Consent: I have reviewed the patients History and Physical, chart, labs and discussed the procedure including the risks, benefits and alternatives for the proposed anesthesia with the patient or authorized representative who has indicated his/her understanding and acceptance.   Dental advisory given  Plan Discussed with: CRNA  Anesthesia Plan Comments:         Anesthesia Quick Evaluation

## 2016-12-29 NOTE — Transfer of Care (Signed)
Immediate Anesthesia Transfer of Care Note  Patient: Vilda A. Shenberger  Procedure(s) Performed: Procedure(s): LAPAROSCOPIC UMBILICAL HERNIA REPAIR WITH MESH (N/A) INSERTION OF MESH (N/A)  Patient Location: PACU  Anesthesia Type:General  Level of Consciousness: awake and alert   Airway & Oxygen Therapy: Patient Spontanous Breathing and Patient connected to nasal cannula oxygen  Post-op Assessment: Report given to RN, Post -op Vital signs reviewed and stable and Patient moving all extremities X 4  Post vital signs: Reviewed and stable  Last Vitals:  Vitals:   12/29/16 0549 12/29/16 0820  BP: 118/63 (!) 143/82  Pulse: 85 (!) 103  Resp: 18 (!) 23  Temp: 37.2 C 36.6 C    Last Pain:  Vitals:   12/29/16 0820  TempSrc:   PainSc: (P) 0-No pain      Patients Stated Pain Goal: 2 (12/29/16 0555)  Complications: No apparent anesthesia complications

## 2016-12-29 NOTE — Op Note (Signed)
12/29/2016  8:05 AM  PATIENT:  Jean ShamesLauren A. Joslin  26 y.o. female  PRE-OPERATIVE DIAGNOSIS:  Umbilical hernia  POST-OPERATIVE DIAGNOSIS:  Umbilical hernia  PROCEDURE:  Procedure(s): LAPAROSCOPIC UMBILICAL HERNIA REPAIR WITH MESH (N/A) INSERTION OF MESH (N/A)  SURGEON:  Surgeon(s) and Role:    * Axel FillerArmando Diontay Rosencrans, MD - Primary   ANESTHESIA:   local and general  EBL:  <5cc  BLOOD ADMINISTERED:none  DRAINS: none   LOCAL MEDICATIONS USED:  BUPIVICAINE   SPECIMEN:  No Specimen  DISPOSITION OF SPECIMEN:  N/A  COUNTS:  YES  TOURNIQUET:  * No tourniquets in log *  DICTATION: .Dragon Dictation  Details of the procedure:   After the patient was consented patient was taken back to the operating room patient was then placed in supine position bilateral SCDs in place.  The patient was prepped and draped in the usual sterile fashion. After antibiotics were confirmed a timeout was called and all facts were verified. The Veress needle technique was used to insuflate the abdomen at Palmer's point. The abdomen was insufflated to 14 mm mercury. Subsequently a 5 mm trocar was placed a camera inserted there was no injury to any intra-abdominal organs.    There was seen to be an unincarcerated  umbilical hernia.  A second camera port was in placed into the left lower quadrant.   At this the Falicform ligament was taken down with Bovie cautery maintaining hemostasis to make clearance for the mesh to lay flat to the abdominal wall.   I proceeded to reduce the hernia contents.  Once the hernia was cleared away, a Bard Ventralight 11.4cm  mesh was inserted into the abdomen.  The mesh was secured circumferentially with am Securestrap tacker in a double crown fashion.    The omentum was brought over the area of the mesh. The pneumoperitoneum was evacuated  & all trocars  were removed. The skin was reapproximated with 4-0  Monocryl sutures in a subcuticular fashion. The skin was dressed with Steri-Strips  tape and gauze.  The patient was taken to the recovery room in stable condition.   PLAN OF CARE: Discharge to home after PACU  PATIENT DISPOSITION:  PACU - hemodynamically stable.   Delay start of Pharmacological VTE agent (>24hrs) due to surgical blood loss or risk of bleeding: not applicable

## 2016-12-29 NOTE — Anesthesia Procedure Notes (Signed)
Procedure Name: Intubation Date/Time: 12/29/2016 7:35 AM Performed by: Rejeana Brock L Pre-anesthesia Checklist: Patient identified, Emergency Drugs available, Suction available and Patient being monitored Patient Re-evaluated:Patient Re-evaluated prior to inductionOxygen Delivery Method: Circle System Utilized Preoxygenation: Pre-oxygenation with 100% oxygen Intubation Type: IV induction Ventilation: Mask ventilation without difficulty Laryngoscope Size: Mac and 3 Grade View: Grade I Tube type: Oral Tube size: 7.0 mm Number of attempts: 1 Airway Equipment and Method: Stylet and Oral airway Placement Confirmation: ETT inserted through vocal cords under direct vision,  positive ETCO2 and breath sounds checked- equal and bilateral Secured at: 22 cm Tube secured with: Tape Dental Injury: Teeth and Oropharynx as per pre-operative assessment

## 2016-12-29 NOTE — Interval H&P Note (Signed)
History and Physical Interval Note:  12/29/2016 7:02 AM  Jean Campbell  has presented today for surgery, with the diagnosis of Umbilical hernia  The various methods of treatment have been discussed with the patient and family. After consideration of risks, benefits and other options for treatment, the patient has consented to  Procedure(s): LAPAROSCOPIC UMBILICAL HERNIA REPAIR WITH MESH (N/A) INSERTION OF MESH (N/A) as a surgical intervention .  The patient's history has been reviewed, patient examined, no change in status, stable for surgery.  I have reviewed the patient's chart and labs.  Questions were answered to the patient's satisfaction.     Marigene Ehlersamirez Jr., Jed LimerickArmando

## 2016-12-29 NOTE — Discharge Instructions (Signed)
[facility] _______[facility] ° °UMBILICAL  HERNIA REPAIR: POST OP INSTRUCTIONS ° °Always review your discharge instruction sheet given to you by the facility where your surgery was performed. °IF YOU HAVE DISABILITY OR FAMILY LEAVE FORMS, YOU MUST BRING THEM TO THE OFFICE FOR PROCESSING.   °DO NOT GIVE THEM TO YOUR DOCTOR. ° °1. A  prescription for pain medication may be given to you upon discharge.  Take your pain medication as prescribed, if needed.  If narcotic pain medicine is not needed, then you may take acetaminophen (Tylenol) or ibuprofen (Advil) as needed. °2. Take your usually prescribed medications unless otherwise directed. °If you need a refill on your pain medication, please contact your pharmacy.  They will contact our office to request authorization. Prescriptions will not be filled after 5 pm or on week-ends. °3. You should follow a light diet the first 24 hours after arrival home, such as soup and crackers, etc.  Be sure to include lots of fluids daily.  Resume your normal diet the day after surgery. °4.Most patients will experience some swelling and bruising around the umbilicus or in the groin and scrotum.  Ice packs and reclining will help.  Swelling and bruising can take several days to resolve.  °6. It is common to experience some constipation if taking pain medication after surgery.  Increasing fluid intake and taking a stool softener (such as Colace) will usually help or prevent this problem from occurring.  A mild laxative (Milk of Magnesia or Miralax) should be taken according to package directions if there are no bowel movements after 48 hours. °7. Unless discharge instructions indicate otherwise, you may remove your bandages 24-48 hours after surgery, and you may shower at that time.  You may have steri-strips (small skin tapes) in place directly over the incision.  These strips should be left on the skin for 7-10 days.  If your surgeon used skin glue on the incision, you may  shower in 24 hours.  The glue will flake off over the next 2-3 weeks.  Any sutures or staples will be removed at the office during your follow-up visit. °8. ACTIVITIES:  You may resume regular (light) daily activities beginning the next day--such as daily self-care, walking, climbing stairs--gradually increasing activities as tolerated.  You may have sexual intercourse when it is comfortable.  Refrain from any heavy lifting or straining until approved by your doctor. ° °a.You may drive when you are no longer taking prescription pain medication, you can comfortably wear a seatbelt, and you can safely maneuver your car and apply brakes. °b.RETURN TO WORK:   °_____________________________________________ ° °9.You should see your doctor in the office for a follow-up appointment approximately 2-3 weeks after your surgery.  Make sure that you call for this appointment within a day or two after you arrive home to insure a convenient appointment time. °10.OTHER INSTRUCTIONS: _________________________ °   _____________________________________ ° °WHEN TO CALL YOUR DOCTOR: °1. Fever over 101.0 °2. Inability to urinate °3. Nausea and/or vomiting °4. Extreme swelling or bruising °5. Continued bleeding from incision. °6. Increased pain, redness, or drainage from the incision ° °The clinic staff is available to answer your questions during regular business hours.  Please don’t hesitate to call and ask to speak to one of the nurses for clinical concerns.  If you have a medical emergency, go to the nearest emergency room or call 911.  A surgeon from [facility] is always on call at the hospital ° ° °[address], [address], [place], [place]  [address] ? ° [address] [address], [place], [place]   [address] °[phone] ? [phone] ? FAX [phone] °Web site: [email or web address] ° °

## 2016-12-29 NOTE — H&P (View-Only) (Signed)
History of Present Illness  Patient words: hernia.  The patient is a 26 year old female who presents with an umbilical hernia. patient is a 26-year-old female who is referred by Katelyn Stock, FNP for evaluation of an umbilical hernia. Patient states that she had some pain approximately a month ago. She states that thereafter she noticed a small bulge the inferior portion of her umbilicus. She states she is able to reduce this manually. Patient had no signs or symptoms of incarceration or strangulation. Patient states that lifting, exertion makes the area painful.  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   Allergies  Sulfa Antibiotics  Latex   Medication History  Ortho Tri-Cyclen (28) (0.18/0.215/0.25MG-35 MCG Tablet, Oral) Active. Medications Reconciled  Vitals  12/12/2016 9:01 AM Weight: 158.2 lb Height: 63in Body Surface Area: 1.75 m Body Mass Index: 28.02 kg/m  Temp.: 98.7F(Oral)  Pulse: 97 (Regular)  BP: 110/70 (Sitting, Left Arm, Standard)       Physical Exam  General Mental Status-Alert. General Appearance-Consistent with stated age. Hydration-Well hydrated. Voice-Normal.  Head and Neck Head-normocephalic, atraumatic with no lesions or palpable masses. Trachea-midline. Thyroid Gland Characteristics - normal size and consistency.  Chest and Lung Exam Chest and lung exam reveals -quiet, even and easy respiratory effort with no use of accessory muscles and on auscultation, normal breath sounds, no adventitious sounds and normal vocal resonance. Inspection Chest Wall - Normal. Back - normal.  Cardiovascular Cardiovascular examination reveals -normal heart sounds, regular rate and rhythm with no murmurs and normal pedal pulses bilaterally.  Abdomen Inspection Skin - Scar - no surgical scars. Hernias - Umbilical hernia - Reducible(small  0.5cm). Palpation/Percussion Normal exam - Soft, Non Tender, No Rebound tenderness, No Rigidity (guarding) and No hepatosplenomegaly. Auscultation Normal exam - Bowel sounds normal.    Assessment & Plan  UMBILICAL HERNIA WITHOUT OBSTRUCTION AND WITHOUT GANGRENE (K42.9) Impression: 26-year-old female with a small primary umbilical hernia.  1. The patient like to proceed to the operating for laparoscopic umbilical hernia repair with mesh 2. All risks and benefits were discussed with the patient to generally include, but not limited to: infection, bleeding, damage to surrounding structures, acute and chronic nerve pain, and recurrence. Alternatives were offered and described. All questions were answered and the patient voiced understanding of the procedure and wishes to proceed at this point with hernia repair. 

## 2016-12-29 NOTE — Anesthesia Postprocedure Evaluation (Addendum)
Anesthesia Post Note  Patient: Jean Campbell  Procedure(s) Performed: Procedure(s) (LRB): LAPAROSCOPIC UMBILICAL HERNIA REPAIR WITH MESH (N/A) INSERTION OF MESH (N/A)  Patient location during evaluation: PACU Anesthesia Type: General Level of consciousness: awake and alert Pain management: pain level controlled Vital Signs Assessment: post-procedure vital signs reviewed and stable Respiratory status: spontaneous breathing, nonlabored ventilation, respiratory function stable and patient connected to nasal cannula oxygen Cardiovascular status: blood pressure returned to baseline and stable Postop Assessment: no signs of nausea or vomiting Anesthetic complications: no       Last Vitals:  Vitals:   12/29/16 0915 12/29/16 0920  BP:  118/71  Pulse: (!) 59 76  Resp: 13 18  Temp:  36.7 C    Last Pain:  Vitals:   12/29/16 0920  TempSrc:   PainSc: 3                  Latangela Mccomas,JAMES TERRILL

## 2016-12-30 ENCOUNTER — Encounter (HOSPITAL_COMMUNITY): Payer: Self-pay | Admitting: General Surgery

## 2017-02-27 ENCOUNTER — Telehealth: Payer: Self-pay | Admitting: Family

## 2017-02-27 NOTE — Telephone Encounter (Signed)
Patient aware that she needs to keep [facility] to get her rash looked at.

## 2017-02-28 ENCOUNTER — Encounter: Payer: Self-pay | Admitting: Family

## 2017-02-28 ENCOUNTER — Telehealth: Payer: Self-pay | Admitting: Family

## 2017-02-28 ENCOUNTER — Ambulatory Visit (INDEPENDENT_AMBULATORY_CARE_PROVIDER_SITE_OTHER): Payer: Self-pay | Admitting: Family

## 2017-02-28 VITALS — BP 126/85 | HR 97 | Temp 99.3°F | Ht 63.0 in | Wt 156.8 lb

## 2017-02-28 DIAGNOSIS — F331 Major depressive disorder, recurrent, moderate: Secondary | ICD-10-CM

## 2017-02-28 DIAGNOSIS — Z1159 Encounter for screening for other viral diseases: Secondary | ICD-10-CM

## 2017-02-28 DIAGNOSIS — L232 Allergic contact dermatitis due to cosmetics: Secondary | ICD-10-CM

## 2017-02-28 DIAGNOSIS — F17200 Nicotine dependence, unspecified, uncomplicated: Secondary | ICD-10-CM

## 2017-02-28 DIAGNOSIS — F411 Generalized anxiety disorder: Secondary | ICD-10-CM

## 2017-02-28 MED ORDER — BUSPIRONE HCL 7.5 MG PO TABS
7.5000 mg | ORAL_TABLET | Freq: Three times a day (TID) | ORAL | 3 refills | Status: DC
Start: 2017-02-28 — End: 2017-06-02

## 2017-02-28 MED ORDER — FLUOXETINE HCL 20 MG PO TABS: 20.0000 mg | ORAL_TABLET | Freq: Every day | ORAL | 3 refills | Status: DC

## 2017-02-28 MED ORDER — FLUCONAZOLE 150 MG PO TABS: 150.0000 mg | ORAL_TABLET | ORAL | 0 refills | Status: DC | PRN

## 2017-02-28 MED ORDER — PREDNISONE 10 MG (21) PO TBPK: ORAL_TABLET | ORAL | 0 refills | Status: DC

## 2017-02-28 NOTE — Progress Notes (Signed)
Subjective:    Patient ID: Jean Campbell, female    DOB: 10/15/1991, 26 y.o.   MRN: [id number]  Rash  This is a new problem. The current episode started more than 1 month ago. The problem has been gradually worsening since onset. The affected locations include the head, back, left arm, right arm, left lower leg, left upper leg, right lower leg and right upper leg. The rash is characterized by itchiness, redness and burning. Associated with: applied a new make-up. Pertinent negatives include no congestion, diarrhea, joint pain, rhinorrhea or shortness of breath.   PT states her sister is positive for hepatitis C and wants to be tested. PT reports increased GAD and Depression. States she can not sleep at night and worries about everything.     Review of Systems  HENT: Negative for congestion and rhinorrhea.   Respiratory: Negative for shortness of breath.   Gastrointestinal: Negative for diarrhea.  Musculoskeletal: Negative for joint pain.  Skin: Positive for rash (face).  All other systems reviewed and are negative.      Objective:   Physical Exam  Constitutional: She is oriented to person, place, and time. She appears well-developed and well-nourished. No distress.  HENT:  Head: Normocephalic.  Eyes: Pupils are equal, round, and reactive to light.  Neck: Normal range of motion. Neck supple. No thyromegaly present.  Cardiovascular: Normal rate, regular rhythm, normal heart sounds and intact distal pulses.   No murmur heard. Pulmonary/Chest: Effort normal and breath sounds normal. No respiratory distress. She has no wheezes.  Abdominal: Soft. Bowel sounds are normal. She exhibits no distension. There is no tenderness.  Musculoskeletal: Normal range of motion. She exhibits no edema or tenderness.  Neurological: She is alert and oriented to person, place, and time.  Skin: Skin is warm and dry. Rash noted.  Scattered erythemas papule rash on face.   Psychiatric: Her behavior is  normal. Judgment and thought content normal. Her mood appears anxious.  Vitals reviewed.     BP 126/85   Pulse 97   Temp 99.3 F (37.4 C) (Oral)   Ht 5\' 3"  (1.6 m)   Wt 156 lb 12.8 oz (71.1 kg)   BMI 27.78 kg/m      Assessment & Plan:  1. Current smoker  2. GAD (generalized anxiety disorder) -Pt started on Buspar TID prn and Prozac 20 mg daily Stress management discussed RTO in 6 weeks - busPIRone (BUSPAR) 7.5 MG tablet; Take 1 tablet (7.5 mg total) by mouth 3 (three) times daily.  Dispense: 90 tablet; Refill: 3 - FLUoxetine (PROZAC) 20 MG tablet; Take 1 tablet (20 mg total) by mouth daily.  Dispense: 90 tablet; Refill: 3  3. Moderate episode of recurrent major depressive disorder (HCC) -Pt started on Buspar TID prn and Prozac 20 mg daily Stress management discussed RTO in 6 weeks - busPIRone (BUSPAR) 7.5 MG tablet; Take 1 tablet (7.5 mg total) by mouth 3 (three) times daily.  Dispense: 90 tablet; Refill: 3 - FLUoxetine (PROZAC) 20 MG tablet; Take 1 tablet (20 mg total) by mouth daily.  Dispense: 90 tablet; Refill: 3  4. Allergic contact dermatitis due to cosmetics -Go back to make-up she was previously using Benadryl as needed Daily zyrtec at needed  RTO prn  - predniSONE (STERAPRED UNI-PAK 21 TAB) 10 MG (21) TBPK tablet; Use as directed  Dispense: 21 tablet; Refill: 0  5. Need for hepatitis C screening test - Hepatitis C antibody   Continue all meds Labs pending Health  Maintenance reviewed Diet and exercise encouraged RTO 6 to recheck GAD and Depression  Jannifer Rodney, FNP

## 2017-02-28 NOTE — Telephone Encounter (Signed)
What is the name of the medication? Diflucan  Have you contacted your pharmacy to request a refill? no  Which pharmacy would you like this sent to? [facility] in [place]   Patient notified that their request is being sent to the clinical staff for review and that they should receive a call once it is complete. If they do not receive a call within 24 hours they can check with their pharmacy or our office.

## 2017-02-28 NOTE — Addendum Note (Signed)
Addended by: Jannifer RodneyHAWKS, Addaleigh Nicholls A on: 02/28/2017 09:21 AM   Modules accepted: Orders

## 2017-02-28 NOTE — Telephone Encounter (Signed)
Prescription sent to pharmacy.

## 2017-02-28 NOTE — Patient Instructions (Signed)
Contact Dermatitis Dermatitis is redness, soreness, and swelling (inflammation) of the skin. Contact dermatitis is a reaction to certain substances that touch the skin. There are two types of contact dermatitis:  Irritant contact dermatitis. This type is caused by something that irritates your skin, such as dry hands from washing them too much. This type does not require previous exposure to the substance for a reaction to occur. This type is more common.  Allergic contact dermatitis. This type is caused by a substance that you are allergic to, such as a nickel allergy or poison ivy. This type only occurs if you have been exposed to the substance (allergen) before. Upon a repeat exposure, your body reacts to the substance. This type is less common. What are the causes? Many different substances can cause contact dermatitis. Irritant contact dermatitis is most commonly caused by exposure to:  Makeup.  Soaps.  Detergents.  Bleaches.  Acids.  Metal salts, such as nickel. Allergic contact dermatitis is most commonly caused by exposure to:  Poisonous plants.  Chemicals.  Jewelry.  Latex.  Medicines.  Preservatives in products, such as clothing. What increases the risk? This condition is more likely to develop in:  People who have jobs that expose them to irritants or allergens.  People who have certain medical conditions, such as asthma or eczema. What are the signs or symptoms? Symptoms of this condition may occur anywhere on your body where the irritant has touched you or is touched by you. Symptoms include:  Dryness or flaking.  Redness.  Cracks.  Itching.  Pain or a burning feeling.  Blisters.  Drainage of small amounts of blood or clear fluid from skin cracks. With allergic contact dermatitis, there may also be swelling in areas such as the eyelids, mouth, or genitals. How is this diagnosed? This condition is diagnosed with a medical history and physical exam.  A patch skin test may be performed to help determine the cause. If the condition is related to your job, you may need to see an occupational medicine specialist. How is this treated? Treatment for this condition includes figuring out what caused the reaction and protecting your skin from further contact. Treatment may also include:  Steroid creams or ointments. Oral steroid medicines may be needed in more severe cases.  Antibiotics or antibacterial ointments, if a skin infection is present.  Antihistamine lotion or an antihistamine taken by mouth to ease itching.  A bandage (dressing). Follow these instructions at home: Skin Care   Moisturize your skin as needed.  Apply cool compresses to the affected areas.  Try taking a bath with:  Epsom salts. Follow the instructions on the packaging. You can get these at your local pharmacy or grocery store.  Baking soda. Pour a small amount into the bath as directed by your health care provider.  Colloidal oatmeal. Follow the instructions on the packaging. You can get this at your local pharmacy or grocery store.  Try applying baking soda paste to your skin. Stir water into baking soda until it reaches a paste-like consistency.  Do not scratch your skin.  Bathe less frequently, such as every other day.  Bathe in lukewarm water. Avoid using hot water. Medicines   Take or apply over-the-counter and prescription medicines only as told by your health care provider.  If you were prescribed an antibiotic medicine, take or apply your antibiotic as told by your health care provider. Do not stop using the antibiotic even if your condition starts to improve. General   instructions   Keep all follow-up visits as told by your health care provider. This is important.  Avoid the substance that caused your reaction. If you do not know what caused it, keep a journal to try to track what caused it. Write down:  What you eat.  What cosmetic products  you use.  What you drink.  What you wear in the affected area. This includes jewelry.  If you were given a dressing, take care of it as told by your health care provider. This includes when to change and remove it. Contact a health care provider if:  Your condition does not improve with treatment.  Your condition gets worse.  You have signs of infection such as swelling, tenderness, redness, soreness, or warmth in the affected area.  You have a fever.  You have new symptoms. Get help right away if:  You have a severe headache, neck pain, or neck stiffness.  You vomit.  You feel very sleepy.  You notice red streaks coming from the affected area.  Your bone or joint underneath the affected area becomes painful after the skin has healed.  The affected area turns darker.  You have difficulty breathing. This information is not intended to replace advice given to you by your health care provider. Make sure you discuss any questions you have with your health care provider. Document Released: 11/18/2000 Document Revised: 04/28/2016 Document Reviewed: 04/08/2015 Elsevier Interactive Patient Education  2017 Elsevier Inc.  

## 2017-03-01 LAB — CMP14+EGFR
ALT: 14 IU/L (ref 0–32)
AST: 18 IU/L (ref 0–40)
Albumin/Globulin Ratio: 2 (ref 1.2–2.2)
Albumin: 4.6 g/dL (ref 3.5–5.5)
Alkaline Phosphatase: 62 IU/L (ref 39–117)
BUN/Creatinine Ratio: 10 (ref 9–23)
BUN: 7 mg/dL (ref 6–20)
Bilirubin Total: 0.5 mg/dL (ref 0.0–1.2)
CALCIUM: 9.4 mg/dL (ref 8.7–10.2)
CHLORIDE: 99 mmol/L (ref 96–106)
CO2: 23 mmol/L (ref 18–29)
Creatinine, Ser: 0.72 mg/dL (ref 0.57–1.00)
GFR, EST AFRICAN AMERICAN: 132 mL/min/{1.73_m2} (ref >59)
GFR, EST NON AFRICAN AMERICAN: 114 mL/min/{1.73_m2} (ref >59)
GLUCOSE: 95 mg/dL (ref 65–99)
Globulin, Total: 2.3 g/dL (ref 1.5–4.5)
Potassium: 4.4 mmol/L (ref 3.5–5.2)
Sodium: 136 mmol/L (ref 134–144)
TOTAL PROTEIN: 6.9 g/dL (ref 6.0–8.5)

## 2017-03-01 LAB — HEPATITIS C ANTIBODY: Hep C Virus Ab: <0.1 s/co ratio (ref 0.0–0.9)

## 2017-03-06 ENCOUNTER — Telehealth: Payer: Self-pay | Admitting: Family

## 2017-03-06 ENCOUNTER — Telehealth: Payer: Self-pay | Admitting: *Deleted

## 2017-03-06 NOTE — Telephone Encounter (Signed)
Call given to nurse °

## 2017-03-06 NOTE — Telephone Encounter (Signed)
Work note faxed to [phone] per pt request Okayed per [employer]

## 2017-03-06 NOTE — Telephone Encounter (Signed)
Please write pt work note

## 2017-03-06 NOTE — Telephone Encounter (Signed)
Pt called in to request work note for today Pt given new RX for Buspar Took 1 dose yesterday Medication caused extreme sedation Pt still very sedated today and unable to work Please advise

## 2017-03-09 ENCOUNTER — Telehealth: Payer: Self-pay | Admitting: Family

## 2017-03-10 ENCOUNTER — Telehealth: Payer: Self-pay | Admitting: Family

## 2017-03-10 NOTE — Telephone Encounter (Signed)
Unable to take 1/2 due to medication being capsule Pt wants to try taking medication earlier in the day Will call back if sxs persist or worsen

## 2017-03-10 NOTE — Telephone Encounter (Signed)
Try taking 1/2 for a week then increased to 1 tab at night.

## 2017-03-10 NOTE — Telephone Encounter (Signed)
Left message for patient to call.

## 2017-03-13 ENCOUNTER — Telehealth: Payer: Self-pay | Admitting: Family

## 2017-03-14 ENCOUNTER — Encounter: Payer: Self-pay | Admitting: Family

## 2017-03-14 ENCOUNTER — Ambulatory Visit (INDEPENDENT_AMBULATORY_CARE_PROVIDER_SITE_OTHER): Payer: Self-pay | Admitting: Family

## 2017-03-14 VITALS — BP 130/86 | HR 99 | Temp 97.4°F | Ht 63.0 in | Wt 151.4 lb

## 2017-03-14 DIAGNOSIS — F410 Panic disorder [episodic paroxysmal anxiety] without agoraphobia: Secondary | ICD-10-CM

## 2017-03-14 DIAGNOSIS — F331 Major depressive disorder, recurrent, moderate: Secondary | ICD-10-CM

## 2017-03-14 DIAGNOSIS — F411 Generalized anxiety disorder: Secondary | ICD-10-CM

## 2017-03-14 NOTE — Patient Instructions (Signed)

## 2017-03-14 NOTE — Progress Notes (Signed)
   Subjective:    Patient ID: Jean Campbell, female    DOB: [birth year], 26 y.o.   MRN: [id number]  PT presents to the office today recheck GAD and depression.  PT was started on prozac 20 mg and Buspar TID prn on 02/28/17. Pt states she feels like her the prozac has helped greatly, but feels flat at times. Pt has only had to take two buspars as needed. PT states she had a panic attack this morning and felt anxious. She took a buspar this morning and has felt better since.  Anxiety  Presents for follow-up visit. Symptoms include decreased concentration, depressed mood, excessive worry, irritability, nervous/anxious behavior, panic and restlessness. Symptoms occur occasionally.   Her past medical history is significant for depression.  Depression       The patient presents with depression.  This is a chronic problem.  The current episode started more than 1 year ago.   The onset quality is gradual.   The problem occurs intermittently.  The problem has been waxing and waning since onset.  Associated symptoms include decreased concentration, restlessness, decreased interest and sad.  Associated symptoms include no helplessness and no hopelessness.  Past treatments include SSRIs - Selective serotonin reuptake inhibitors.  Compliance with treatment is good.  Risk factors include stress.   Past medical history includes anxiety and depression.       Review of Systems  Constitutional: Positive for irritability.  Psychiatric/Behavioral: Positive for decreased concentration and depression. The patient is nervous/anxious.   All other systems reviewed and are negative.      Objective:   Physical Exam  Constitutional: She is oriented to person, place, and time. She appears well-developed and well-nourished. No distress.  HENT:  Head: Normocephalic.  Eyes: Pupils are equal, round, and reactive to light.  Neck: Normal range of motion. Neck supple. No thyromegaly present.  Cardiovascular: Normal rate,  regular rhythm, normal heart sounds and intact distal pulses.   No murmur heard. Pulmonary/Chest: Effort normal and breath sounds normal. No respiratory distress. She has no wheezes.  Abdominal: Soft. Bowel sounds are normal. She exhibits no distension. There is no tenderness.  Musculoskeletal: Normal range of motion. She exhibits no edema or tenderness.  Neurological: She is alert and oriented to person, place, and time.  Skin: Skin is warm and dry.  Psychiatric: She has a normal mood and affect. Her behavior is normal. Judgment and thought content normal.  Vitals reviewed.     BP 130/86   Pulse 99   Temp 97.4 F (36.3 C) (Oral)   Ht  (1.6 m)   Wt 151 lb 6.4 oz (68.7 kg)   LMP 03/14/2017   BMI 26.82 kg/m      Assessment & Plan:  1. Moderate episode of recurrent major depressive disorder (HCC)  2. GAD (generalized anxiety disorder)  3. Panic attack  Continue Prozac 20 mg daily and use Buspar 7.5 mg TID prn Stress management discussed Pt to call and in 3 weeks and let me know how she is doing, if still having anxiety will increase Prozac  RTO in 2 month  Jannifer Rodney, FNP

## 2017-04-04 ENCOUNTER — Telehealth: Payer: Self-pay | Admitting: Family

## 2017-04-04 NOTE — Telephone Encounter (Signed)
LM for patient to call back.

## 2017-04-11 ENCOUNTER — Ambulatory Visit: Payer: Self-pay | Admitting: Family

## 2017-04-12 ENCOUNTER — Telehealth: Payer: Self-pay | Admitting: Family

## 2017-04-12 ENCOUNTER — Encounter: Payer: Self-pay | Admitting: Family

## 2017-04-12 MED ORDER — FLUCONAZOLE 150 MG PO TABS: 150.0000 mg | ORAL_TABLET | ORAL | 0 refills | Status: DC | PRN

## 2017-04-12 NOTE — Telephone Encounter (Signed)
Patient aware, script is ready. 

## 2017-04-12 NOTE — Telephone Encounter (Signed)
Please review and advise.

## 2017-04-12 NOTE — Telephone Encounter (Signed)
Diflucan Prescription sent to pharmacy   

## 2017-04-12 NOTE — Telephone Encounter (Signed)
What symptoms do you have? Yeast infection  How long have you been sick? Two days  Have you been seen for this problem? In the past  If your provider decides to give you a prescription, which pharmacy would you like for it to be sent to? [facility] [place]. She needs two pills   Patient informed that this information will be sent to the clinical staff for review and that they should receive a follow up call.

## 2017-04-13 ENCOUNTER — Ambulatory Visit: Payer: Self-pay | Admitting: Family

## 2017-04-17 ENCOUNTER — Encounter: Payer: Self-pay | Admitting: Family

## 2017-04-17 ENCOUNTER — Telehealth: Payer: Self-pay | Admitting: Family

## 2017-04-17 NOTE — Telephone Encounter (Signed)
Left message for patient to call to reschedule and hope she is doing well.

## 2017-04-21 NOTE — Telephone Encounter (Signed)
Phone call taken care of in different encounter.  This encounter will now be closed  

## 2017-05-05 NOTE — Addendum Note (Signed)
Addendum  created 05/05/17 1002 by Sharee HolsterMassagee, Cardale Dorer, MD   Sign clinical note

## 2017-05-08 ENCOUNTER — Telehealth: Payer: Self-pay | Admitting: Family

## 2017-05-09 MED ORDER — CLOTRIMAZOLE 1 % EX CREA: 1.0000 "application " | TOPICAL_CREAM | Freq: Two times a day (BID) | CUTANEOUS | 0 refills | Status: DC

## 2017-05-09 NOTE — Telephone Encounter (Signed)
Please review and advise.

## 2017-05-09 NOTE — Telephone Encounter (Signed)
Clotrimazole Prescription sent to pharmacy

## 2017-05-23 ENCOUNTER — Emergency Department (HOSPITAL_COMMUNITY): Payer: Self-pay

## 2017-05-23 ENCOUNTER — Encounter (HOSPITAL_COMMUNITY): Payer: Self-pay

## 2017-05-23 ENCOUNTER — Emergency Department (HOSPITAL_COMMUNITY)
Admission: EM | Admit: 2017-05-23 | Discharge: 2017-05-23 | Disposition: A | Discharge status: Home / Self Care | Payer: Self-pay | Attending: Emergency Medicine | Admitting: Emergency Medicine

## 2017-05-23 DIAGNOSIS — Y929 Unspecified place or not applicable: Secondary | ICD-10-CM | POA: Insufficient documentation

## 2017-05-23 DIAGNOSIS — F17210 Nicotine dependence, cigarettes, uncomplicated: Secondary | ICD-10-CM | POA: Insufficient documentation

## 2017-05-23 DIAGNOSIS — Y9301 Activity, walking, marching and hiking: Secondary | ICD-10-CM | POA: Insufficient documentation

## 2017-05-23 DIAGNOSIS — S99922A Unspecified injury of left foot, initial encounter: Secondary | ICD-10-CM

## 2017-05-23 DIAGNOSIS — Y999 Unspecified external cause status: Secondary | ICD-10-CM | POA: Insufficient documentation

## 2017-05-23 DIAGNOSIS — W1789XA Other fall from one level to another, initial encounter: Secondary | ICD-10-CM | POA: Insufficient documentation

## 2017-05-23 DIAGNOSIS — S99822A Other specified injuries of left foot, initial encounter: Secondary | ICD-10-CM | POA: Insufficient documentation

## 2017-05-23 NOTE — ED Provider Notes (Signed)
[facility]PT Provider Note   CSN: [id number] Arrival date & time: 05/23/17  2056     History   Chief Complaint Chief Complaint  Patient presents with  . Foot Injury    HPI Jean Campbell is a 26 y.o. female.  HPI  Patient reports left heel pain that began yesterday after tripping over the curb outside and putting all of her weight on her left heel to prevent any further injury. She states that since then she has pain in applying pressure and walking. She has been able to walk on her toes. She has been using warm Epsom salt baths and ibuprofen with no relief in symptoms. She denies any numbness, weakness, temperature changes, head injury, vision changes, previous injury to this foot, previous procedure in this foot, bruising, paresthesias.  Past Medical History:  Diagnosis Date  . Anemia    as a young child  . Anxiety   . Carpal tunnel syndrome    right  . Depression    not treated  . GERD (gastroesophageal reflux disease)   . 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 IBS (irritable bowel syndrome)   . PONV (postoperative nausea and vomiting)    after tonsils    Patient Active Problem List   Diagnosis Date Noted  . Current smoker 02/28/2017  . Depression 07/11/2016  . GAD (generalized anxiety disorder) 07/11/2016    Past Surgical History:  Procedure Laterality Date  . INSERTION OF MESH N/A 12/29/2016   Procedure: INSERTION OF MESH;  Surgeon: Axel FillerArmando Ramirez, MD;  Location: [facility] OR;  Service: General;  Laterality: N/A;  . TONSILLECTOMY    . TONSILLECTOMY  1996  . UMBILICAL HERNIA REPAIR N/A 12/29/2016   Procedure: LAPAROSCOPIC UMBILICAL HERNIA REPAIR WITH MESH;  Surgeon: Axel FillerArmando Ramirez, MD;  Location: [facility] OR;  Service: General;  Laterality: N/A;    OB History    No data available       Home Medications    Prior to Admission medications   Medication Sig Start Date End Date Taking? Authorizing Provider  busPIRone (BUSPAR) 7.5 MG tablet Take 1  tablet (7.5 mg total) by mouth 3 (three) times daily. Patient not taking: Reported on 05/23/2017 02/28/17   Jannifer RodneyHawks, Christy A, FNP  clotrimazole (LOTRIMIN) 1 % cream Apply 1 application topically 2 (two) times daily. Patient not taking: Reported on 05/23/2017 05/09/17   Jannifer RodneyHawks, Christy A, FNP  fluconazole (DIFLUCAN) 150 MG tablet Take 1 tablet (150 mg total) by mouth every three (3) days as needed. Patient not taking: Reported on 05/23/2017 04/12/17   Jannifer RodneyHawks, Christy A, FNP  FLUoxetine (PROZAC) 20 MG tablet Take 1 tablet (20 mg total) by mouth daily. Patient not taking: Reported on 05/23/2017 02/28/17   Junie SpencerHawks, Christy A, FNP    Family History Family History  Problem Relation Age of Onset  . Hyperlipidemia Mother     Social History Social History  Substance Use Topics  . Smoking status: Current Every Day Smoker    Types: Cigarettes    Start date: 04/02/2012    Last attempt to quit: 03/19/2013  . Smokeless tobacco: Never Used     Comment: 3-4 cigarettes a day  . Alcohol use Yes     Comment: moderate use     Allergies   Lactase; Latex; and Sulfa antibiotics   Review of Systems Review of Systems  Constitutional: Negative for appetite change, chills and fever.  Eyes: Negative for photophobia and visual disturbance.  Respiratory: Negative for shortness of breath.   Cardiovascular: Negative for chest pain.  Gastrointestinal: Negative for nausea and vomiting.  Musculoskeletal: Positive for gait problem, joint swelling and myalgias.  Skin: Negative for rash.  Neurological: Negative for dizziness, syncope, weakness, light-headedness and headaches.     Physical Exam Updated Vital Signs BP 125/76 (BP Location: Right Arm)   Pulse (!) 109   Temp 97.9 F (36.6 C) (Oral)   Resp 16   Ht 5\' 3"  (1.6 m)   Wt 68 kg (150 lb)   LMP 05/14/2017   SpO2 98%   BMI 26.57 kg/m   Physical Exam  Constitutional: She appears well-developed and well-nourished. No distress.  HENT:  Head: Normocephalic  and atraumatic.  Eyes: Conjunctivae and EOM are normal. No scleral icterus.  Neck: Normal range of motion.  Pulmonary/Chest: Effort normal. No respiratory distress.  Musculoskeletal: Normal range of motion. She exhibits tenderness. She exhibits no edema or deformity.       Feet:  Point tenderness to palpation at the indicated area on hemorrhage. No visible deformity, bruising, other causes change, temperature change or edema noted. Full active and passive range of motion of the ankle and digits. Pain noted with weightbearing. DP pulses 2+ bilaterally. Sensation intact to light touch. No calf tenderness present.  No midline spinal tenderness palpated. No step-off palpated. No visible bruising noted.  Neurological: She is alert.  Skin: No rash noted. She is not diaphoretic.  Psychiatric: She has a normal mood and affect.  Nursing note and vitals reviewed.    ED Treatments / Results  Labs (all labs ordered are listed, but only abnormal results are displayed) Labs Reviewed - No data to display  EKG  EKG Interpretation None       Radiology Dg Foot Complete Left  Result Date: 05/23/2017 CLINICAL DATA:  26 y/o F; status post fall with left foot pain at the bottom of the foot. Initial encounter. EXAM: LEFT FOOT - COMPLETE 3+ VIEW COMPARISON:  None. FINDINGS: Moderate-sized plantar calcaneal bone spur. There appears to be a minimally displaced acute fracture of the bone spur. No other fracture of the foot is identified. Lisfranc alignment is maintained. IMPRESSION: Moderate-sized plantar calcaneal bone spur. Minimally displaced fracture of the bones spur. No other fracture identified. Electronically Signed   By: Mitzi Hansen M.D.   On: 05/23/2017 21:54    Procedures Procedures (including critical care time)  Medications Ordered in ED Medications - No data to display   Initial Impression / Assessment and Plan / ED Course  I have reviewed the triage vital signs and the  nursing notes.  Pertinent labs & imaging results that were available during my care of the patient were reviewed by me and considered in my medical decision making (see chart for details).     Patient presents to ED for complaints of left heel pain that began after tripping off of a curb and landing on her left heel. She states that the pain began yesterday after the incident. States that since then she has pain with weightbearing. On physical exam there is tenderness to palpation but no visible bruising, edema, temperature change noted. Patient is afebrile with no history of fever. She denies any back pain, head injury or pain elsewhere. No spinal tenderness to palpation or bruising present. No imaging of spine warranted at this time. X-ray of foot showed minimally displaced fracture of her plantar calcaneal bone spur. Patient states that she is aware that she has bilateral bone  spurs on both of her heels. Will place patient in a postop boot for support and give crutches. Advise her to follow-up with her PCP for further evaluation. Continue anti-inflammatories, rest and ice for symptomatic relief. Patient appears stable for discharge at this time. Strict return precautions given.  Final Clinical Impressions(s) / ED Diagnoses   Final diagnoses:  Injury of left foot, initial encounter    New Prescriptions Discharge Medication List as of 05/23/2017 10:09 PM       Dietrich Pates, PA-C 05/24/17 0206    Mesner, Barbara Cower, MD 05/24/17 2035

## 2017-05-23 NOTE — Discharge Instructions (Signed)
Used boot as directed. Rest, ice and elevate extremity. Take ibuprofen or Aleve for pain and inflammation. Follow-up with PCP for further evaluation. Return to ED for worsening symptoms, increased pain, additional injury, numbness, weakness, temperature change of area, inability to move.

## 2017-05-23 NOTE — ED Triage Notes (Signed)
Reports of falling yesterday onto left heel. Unable to put any weight on left heel.

## 2017-05-31 ENCOUNTER — Telehealth: Payer: Self-pay | Admitting: Family

## 2017-05-31 ENCOUNTER — Ambulatory Visit (INDEPENDENT_AMBULATORY_CARE_PROVIDER_SITE_OTHER): Payer: Self-pay | Admitting: Physician Assistant

## 2017-05-31 ENCOUNTER — Encounter: Payer: Self-pay | Admitting: Physician Assistant

## 2017-05-31 VITALS — BP 120/75 | HR 116 | Temp 99.3°F | Ht 63.0 in | Wt 147.0 lb

## 2017-05-31 DIAGNOSIS — R5383 Other fatigue: Secondary | ICD-10-CM

## 2017-05-31 DIAGNOSIS — G4710 Hypersomnia, unspecified: Secondary | ICD-10-CM

## 2017-05-31 DIAGNOSIS — B86 Scabies: Secondary | ICD-10-CM

## 2017-05-31 MED ORDER — CEPHALEXIN 500 MG PO CAPS: 500.0000 mg | ORAL_CAPSULE | Freq: Four times a day (QID) | ORAL | 0 refills | Status: DC

## 2017-05-31 MED ORDER — PERMETHRIN 5 % EX CREA: 1.0000 "application " | TOPICAL_CREAM | Freq: Once | CUTANEOUS | 2 refills | Status: AC

## 2017-05-31 MED ORDER — FLUCONAZOLE 150 MG PO TABS: 150.0000 mg | ORAL_TABLET | ORAL | 0 refills | Status: DC | PRN

## 2017-05-31 NOTE — Patient Instructions (Addendum)
Scabies, Adult Scabies is a skin condition that happens when very small insects get under the skin (infestation). This causes a rash and severe itchiness. Scabies can spread from person to person (is contagious). If you get scabies, it is common for others in your household to get scabies too. With proper treatment, symptoms usually go away in 2-4 weeks. Scabies usually does not cause lasting problems. What are the causes? This condition is caused by mites (Sarcoptes scabiei, or human itch mites) that can only be seen with a microscope. The mites get into the top layer of skin and lay eggs. Scabies can spread from person to person through:  Close contact with a person who has scabies.  Contact with infested items, such as towels, bedding, or clothing.  What increases the risk? This condition is more likely to develop in:  People who live in nursing homes and other extended-care facilities.  People who have sexual contact with a partner who has scabies.  Young children who attend child care facilities.  People who care for others who are at increased risk for scabies.  What are the signs or symptoms? Symptoms of this condition may include:  Severe itchiness. This is often worse at night.  A rash that includes tiny red bumps or blisters. The rash commonly occurs on the wrist, elbow, armpit, fingers, waist, groin, or buttocks. Bumps may form a line (burrow) in some areas.  Skin irritation. This can include scaly patches or sores.  How is this diagnosed? This condition is diagnosed with a physical exam. Your health care provider will look closely at your skin. In some cases, your health care provider may take a sample of your affected skin (skin scraping) and have it examined under a microscope. How is this treated? This condition may be treated with:  Medicated cream or lotion that kills the mites. This is spread on the entire body and left on for several hours. Usually, one treatment  with medicated cream or lotion is enough to kill all of the mites. In severe cases, the treatment may be repeated.  Medicated cream that relieves itching.  Medicines that help to relieve itching.  Medicines that kill the mites. This treatment is rarely used.  Follow these instructions at home:  Medicines  Take or apply over-the-counter and prescription medicines as told by your health care provider.  Apply medicated cream or lotion as told by your health care provider.  Do not wash off the medicated cream or lotion until the necessary amount of time has passed. Skin Care  Avoid scratching your affected skin.  Keep your fingernails closely trimmed to reduce injury from scratching.  Take cool baths or apply cool washcloths to help reduce itching. General instructions  Clean all items that you recently had contact with, including bedding, clothing, and furniture. Do this on the same day that your treatment starts. ? Use hot water when you wash items. ? Place unwashable items into closed, airtight plastic bags for at least 3 days. The mites cannot live for more than 3 days away from human skin. ? Vacuum furniture and mattresses that you use.  Make sure that other people who may have been infested are examined by a health care provider. These include members of your household and anyone who may have had contact with infested items.  Keep all follow-up visits as told by your health care provider. This is important. Contact a health care provider if:  You have itching that does not go away   after 4 weeks of treatment.  You continue to develop new bumps or burrows.  You have redness, swelling, or pain in your rash area after treatment.  You have fluid, blood, or pus coming from your rash. This information is not intended to replace advice given to you by your health care provider. Make sure you discuss any questions you have with your health care provider. Document Released:  08/12/2015 Document Revised: 04/28/2016 Document Reviewed: 06/23/2015 Elsevier Interactive Patient Education  2018 [employer]vier Inc.  

## 2017-06-01 MED ORDER — PERMETHRIN 5 % EX CREA: TOPICAL_CREAM | CUTANEOUS | 1 refills | Status: DC

## 2017-06-01 NOTE — Telephone Encounter (Signed)
Permethrin  Prescription sent to pharmacy   

## 2017-06-01 NOTE — Telephone Encounter (Signed)
Left detailed message on pt VM that requested rx sent to the pharmacy and to call back with any further questions or concerns.

## 2017-06-02 NOTE — Progress Notes (Signed)
BP 120/75   Pulse (!) 116   Temp 99.3 F (37.4 C) (Oral)   Ht 5\' 3"  (1.6 m)   Wt 147 lb (66.7 kg)   LMP 05/14/2017   BMI 26.04 kg/m    Subjective:    Patient ID: Adalei A. Ruberg, female    DOB: 1991-02-18, 26 y.o.   MRN: [id number]  HPI: Jeanene A. Beckstrom is a 26 y.o. female presenting on 05/31/2017 for Rash, itching all over (cat has had scabies, now dog has scabies)  Has small itching lesions on arms, legs, trunk. Excessive itching at night and after shower. No other known people in life have them.  Relevant past medical, surgical, family and social history reviewed and updated as indicated. Allergies and medications reviewed and updated.  Past Medical History:  Diagnosis Date  . Anemia    as a young child  . Anxiety   . Carpal tunnel syndrome    right  . Depression    not treated  . GERD (gastroesophageal reflux disease)   . 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 IBS (irritable bowel syndrome)   . PONV (postoperative nausea and vomiting)    after tonsils    Past Surgical History:  Procedure Laterality Date  . INSERTION OF MESH N/A 12/29/2016   Procedure: INSERTION OF MESH;  Surgeon: Axel Filler, MD;  Location: [facility] OR;  Service: General;  Laterality: N/A;  . TONSILLECTOMY    . TONSILLECTOMY  1996  . UMBILICAL HERNIA REPAIR N/A 12/29/2016   Procedure: LAPAROSCOPIC UMBILICAL HERNIA REPAIR WITH MESH;  Surgeon: Axel Filler, MD;  Location: [facility] OR;  Service: General;  Laterality: N/A;    Review of Systems  Constitutional: Negative.  Negative for activity change, fatigue and fever.  HENT: Negative.   Eyes: Negative.   Respiratory: Negative.  Negative for cough.   Cardiovascular: Negative.  Negative for chest pain.  Gastrointestinal: Negative.  Negative for abdominal pain.  Endocrine: Negative.   Genitourinary: Negative.  Negative for dysuria.  Musculoskeletal: Negative.   Skin: Positive for color change and rash.  Neurological: Negative.      Allergies as of 05/31/2017      Reactions   Lactase    Other reaction(s): GI Upset (intolerance)   Latex Rash   Sulfa Antibiotics Rash   Childhood allergy       Medication List       Accurate as of 05/31/17 11:59 PM. Always use your most recent med list.          cephALEXin 500 MG capsule Commonly known as:  KEFLEX Take 1 capsule (500 mg total) by mouth 4 (four) times daily.   fluconazole 150 MG tablet Commonly known as:  DIFLUCAN Take 1 tablet (150 mg total) by mouth every three (3) days as needed.   permethrin 5 % cream Commonly known as:  ELIMITE Apply 1 application topically once. Shower, Apply from head down, leave on overnight, wash off in morning   permethrin 5 % cream Commonly known as:  ELIMITE Apply from neck down X8-14 hours then wash off          Objective:    BP 120/75   Pulse (!) 116   Temp 99.3 F (37.4 C) (Oral)   Ht 5\' 3"  (1.6 m)   Wt 147 lb (66.7 kg)   LMP 05/14/2017   BMI 26.04 kg/m   Allergies  Allergen Reactions  . Lactase     Other reaction(s): GI  Upset (intolerance)  . Latex Rash  . Sulfa Antibiotics Rash    Childhood allergy     Physical Exam  Constitutional: She is oriented to person, place, and time. She appears well-developed and well-nourished.  HENT:  Head: Normocephalic and atraumatic.  Eyes: Conjunctivae and EOM are normal. Pupils are equal, round, and reactive to light.  Cardiovascular: Normal rate, regular rhythm, normal heart sounds and intact distal pulses.   Pulmonary/Chest: Effort normal and breath sounds normal.  Abdominal: Soft. Bowel sounds are normal.  Neurological: She is alert and oriented to person, place, and time. She has normal reflexes.  Skin: Skin is warm and dry. Lesion and rash noted. Rash is macular. There is erythema.     Psychiatric: She has a normal mood and affect. Her behavior is normal. Judgment and thought content normal.        Assessment & Plan:   1. Scabie  2.  Hypersomnolence  3. Fatigue, unspecified type   Current Outpatient Prescriptions:  .  cephALEXin (KEFLEX) 500 MG capsule, Take 1 capsule (500 mg total) by mouth 4 (four) times daily., Disp: 40 capsule, Rfl: 0 .  fluconazole (DIFLUCAN) 150 MG tablet, Take 1 tablet (150 mg total) by mouth every three (3) days as needed., Disp: 2 tablet, Rfl: 0 .  permethrin (ELIMITE) 5 % cream, Apply from neck down X8-14 hours then wash off, Disp: 60 g, Rfl: 1  Continue all other maintenance medications as listed above.  Follow up plan: Return if symptoms worsen or fail to improve.  Educational handout given for scabies  Remus LofflerAngel S. Deantae Shackleton PA-C [facility] [address]  [place], [place] [address] [phone]   06/02/2017, 12:50 PM

## 2017-06-05 ENCOUNTER — Telehealth: Payer: Self-pay | Admitting: Physician Assistant

## 2017-06-05 NOTE — Telephone Encounter (Signed)
What symptoms do you have? Spots are itching more than they did she feels like she is getting new spots and itching is worse  How long have you been sick? Since last week   Have you been seen for this problem? Yes for scabies  If your provider decides to give you a prescription, which pharmacy would you like for it to be sent to? [facility]   Patient informed that this information will be sent to the clinical staff for review and that they should receive a follow up call.

## 2017-06-06 NOTE — Telephone Encounter (Signed)
[facility] for rash

## 2017-06-08 ENCOUNTER — Ambulatory Visit: Payer: Self-pay | Admitting: Family

## 2017-06-09 ENCOUNTER — Ambulatory Visit: Payer: Self-pay | Admitting: Family

## 2017-06-12 ENCOUNTER — Encounter: Payer: Self-pay | Admitting: Family

## 2017-06-12 ENCOUNTER — Ambulatory Visit: Payer: Self-pay | Admitting: Family

## 2017-06-13 NOTE — Telephone Encounter (Signed)
There is an alert that she has been discharged from this department?  Do I need to handle this?

## 2017-06-13 NOTE — Telephone Encounter (Signed)
Patient has had 3 appointment for this issue on 7/5,6, and 9. Patient canceled and no showed.

## 2017-06-13 NOTE — Telephone Encounter (Signed)
Since she is discharged and I only saw once in Urgent Clinic. She is not my patient so I am not able to do anything else. Refer to urgent care.

## 2017-06-13 NOTE — Telephone Encounter (Signed)
No she was sent a discharge letter for No-Shows and this isn't an emergency.

## 2017-06-19 ENCOUNTER — Encounter: Payer: Self-pay | Admitting: *Deleted

## 2017-06-19 ENCOUNTER — Telehealth: Payer: Self-pay | Admitting: Family

## 2017-06-19 MED ORDER — PERMETHRIN 5 % EX CREA: TOPICAL_CREAM | CUTANEOUS | 1 refills | Status: DC

## 2017-06-19 NOTE — Telephone Encounter (Signed)
Pt aware of meds- LN for her to call back about the note

## 2017-06-19 NOTE — Telephone Encounter (Signed)
Pt called - note up front

## 2017-06-19 NOTE — Telephone Encounter (Signed)
Dr Ermalinda MemosBradshaw = please address as coverage for A Jones. thanks

## 2017-06-19 NOTE — Telephone Encounter (Signed)
Pt with worsening rash, repeat permethrin  Letter for school/work ok.   Murtis SinkSam Makina Skow, MD [facility] 06/19/2017, 8:40 AM

## 2017-06-30 ENCOUNTER — Encounter (HOSPITAL_COMMUNITY): Payer: Self-pay | Admitting: *Deleted

## 2017-06-30 ENCOUNTER — Emergency Department (HOSPITAL_COMMUNITY)
Admission: EM | Admit: 2017-06-30 | Discharge: 2017-06-30 | Disposition: A | Discharge status: Home / Self Care | Payer: Self-pay | Attending: Emergency Medicine | Admitting: Emergency Medicine

## 2017-06-30 DIAGNOSIS — Z91040 Latex allergy status: Secondary | ICD-10-CM | POA: Insufficient documentation

## 2017-06-30 DIAGNOSIS — B86 Scabies: Secondary | ICD-10-CM | POA: Insufficient documentation

## 2017-06-30 DIAGNOSIS — Z79899 Other long term (current) drug therapy: Secondary | ICD-10-CM | POA: Insufficient documentation

## 2017-06-30 DIAGNOSIS — F17210 Nicotine dependence, cigarettes, uncomplicated: Secondary | ICD-10-CM | POA: Insufficient documentation

## 2017-06-30 LAB — POC URINE PREG, ED: Preg Test, Ur: NEGATIVE

## 2017-06-30 MED ORDER — IVERMECTIN 3 MG PO TABS: 200.0000 ug/kg | ORAL_TABLET | Freq: Once | ORAL | 0 refills | Status: AC

## 2017-06-30 NOTE — ED Provider Notes (Signed)
[facility] DEPT Provider Note   CSN: [id number] Arrival date & time: 06/30/17  1818     History   Chief Complaint Chief Complaint  Patient presents with  . Bug Bites    HPI Jean Campbell is a 26 y.o. female presenting with insect bites which is suspected to be scabies as she was exposed to her dog with sarcoptic mange one month ago with development of persistent spreading insect bites.  She was treated for scabies x 2 with permethrin, also had her house deep cleaned, dog treated, husband treated and they have cleared their rashes but hers has persisted. She reports she feels being bit and it seems worsened at night but denies possibility of bed bugs, stating she had a company come to inspect her home and there were no evidence of bed bugs found. She has tried soaking in a bleach water bath and even tried the shampoo her dog was treated with. She is also using benadryl for itch.  She denies fevers, chills or other complaints.  The history is provided by the patient.    Past Medical History:  Diagnosis Date  . Anemia    as a young child  . Anxiety   . Carpal tunnel syndrome    right  . Depression    not treated  . GERD (gastroesophageal reflux disease)   . 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 IBS (irritable bowel syndrome)   . PONV (postoperative nausea and vomiting)    after tonsils    Patient Active Problem List   Diagnosis Date Noted  . Current smoker 02/28/2017  . Depression 07/11/2016  . GAD (generalized anxiety disorder) 07/11/2016    Past Surgical History:  Procedure Laterality Date  . INSERTION OF MESH N/A 12/29/2016   Procedure: INSERTION OF MESH;  Surgeon: Axel FillerArmando Ramirez, MD;  Location: [facility] OR;  Service: General;  Laterality: N/A;  . TONSILLECTOMY    . TONSILLECTOMY  1996  . UMBILICAL HERNIA REPAIR N/A 12/29/2016   Procedure: LAPAROSCOPIC UMBILICAL HERNIA REPAIR WITH MESH;  Surgeon: Axel FillerArmando Ramirez, MD;  Location: [facility] OR;  Service: General;   Laterality: N/A;    OB History    No data available       Home Medications    Prior to Admission medications   Medication Sig Start Date End Date Taking? Authorizing Provider  cephALEXin (KEFLEX) 500 MG capsule Take 1 capsule (500 mg total) by mouth 4 (four) times daily. 05/31/17   Remus LofflerJones, Angel S, PA-C  fluconazole (DIFLUCAN) 150 MG tablet Take 1 tablet (150 mg total) by mouth every three (3) days as needed. 05/31/17   Remus LofflerJones, Angel S, PA-C  ivermectin (STROMECTOL) 3 MG TABS tablet Take 4.5 tablets (13,500 mcg total) by mouth once. Take 4.5 tablets by mouth once. Repeat this dose in 2 weeks 06/30/17 06/30/17  Burgess AmorIdol, Daurice Ovando, PA-C  permethrin (ELIMITE) 5 % cream Apply from neck down X8-14 hours then wash off 06/19/17   Elenora GammaBradshaw, Samuel L, MD    Family History Family History  Problem Relation Age of Onset  . Hyperlipidemia Mother     Social History Social History  Substance Use Topics  . Smoking status: Current Every Day Smoker    Packs/day: 2.00    Types: Cigarettes    Start date: 04/02/2012    Last attempt to quit: 03/19/2013  . Smokeless tobacco: Never Used  . Alcohol use Yes     Comment: moderate use  Allergies   Lactase; Latex; and Sulfa antibiotics   Review of Systems Review of Systems  Constitutional: Negative for chills and fever.  Respiratory: Negative for shortness of breath and wheezing.   Skin: Positive for rash.  Neurological: Negative for numbness.     Physical Exam Updated Vital Signs BP 111/70   Pulse 98   Temp 98.6 F (37 C) (Oral)   Resp 18   Ht 5' 3.25" (1.607 m)   Wt 65.8 kg (145 lb)   SpO2 97%   BMI 25.48 kg/m   Physical Exam  Constitutional: She appears well-developed and well-nourished. No distress.  HENT:  Head: Normocephalic.  Neck: Neck supple.  Cardiovascular: Normal rate.   Pulmonary/Chest: Effort normal. She has no wheezes.  Musculoskeletal: Normal range of motion. She exhibits no edema.  Skin: Rash noted.  Scattered small  insect bites scattered on all extremities, abdomen, especially along waistline, neck with a few lesions in scalp.  No drainage.  There are some areas that appear connected by a tunnel.  No drainage, no redness, induration or fluctuance.     ED Treatments / Results  Labs (all labs ordered are listed, but only abnormal results are displayed) Labs Reviewed  POC URINE PREG, ED    EKG  EKG Interpretation None       Radiology No results found.  Procedures Procedures (including critical care time)  Medications Ordered in ED Medications - No data to display   Initial Impression / Assessment and Plan / ED Course  I have reviewed the triage vital signs and the nursing notes.  Pertinent labs & imaging results that were available during my care of the patient were reviewed by me and considered in my medical decision making (see chart for details).     Pt with persistent rash with known exposure to scabies.  I will switch her to ivermectin.  However, advised that if this does not resolve the sx, she may need to see a dermatologist. Pt became tearful, no insurance, will not be following up with derm. Referral to [facility] who can assist pt in f/u and may be able to offer resources for specialty care if needed.   Final Clinical Impressions(s) / ED Diagnoses   Final diagnoses:  Scabies    New Prescriptions Discharge Medication List as of 06/30/2017  8:16 PM    START taking these medications   Details  ivermectin (STROMECTOL) 3 MG TABS tablet Take 4.5 tablets (13,500 mcg total) by mouth once. Take 4.5 tablets by mouth once. Repeat this dose in 2 weeks, Starting Fri 06/30/2017, Print         Victoriano Laindol, Pritesh Sobecki, PA-C 06/30/17 2121    Donnetta Hutchingook, Brian, MD 07/06/17 1344

## 2017-06-30 NOTE — ED Notes (Signed)
ED Provider at bedside. 

## 2017-06-30 NOTE — ED Triage Notes (Signed)
Pt c/o bug bites all over entire body x 1 month. Pt reports she was diagnosed with scabies 1 month ago and has had her house cleaned several times but she is still having the bug bites.

## 2017-07-20 ENCOUNTER — Encounter (HOSPITAL_COMMUNITY): Payer: Self-pay | Admitting: Emergency Medicine

## 2017-07-20 ENCOUNTER — Emergency Department (HOSPITAL_COMMUNITY)
Admission: EM | Admit: 2017-07-20 | Discharge: 2017-07-21 | Disposition: A | Discharge status: Home / Self Care | Payer: Self-pay | Attending: Emergency Medicine | Admitting: Emergency Medicine

## 2017-07-20 DIAGNOSIS — F17210 Nicotine dependence, cigarettes, uncomplicated: Secondary | ICD-10-CM | POA: Insufficient documentation

## 2017-07-20 DIAGNOSIS — F418 Other specified anxiety disorders: Secondary | ICD-10-CM | POA: Insufficient documentation

## 2017-07-20 DIAGNOSIS — Z0389 Encounter for observation for other suspected diseases and conditions ruled out: Secondary | ICD-10-CM | POA: Insufficient documentation

## 2017-07-20 DIAGNOSIS — Z91040 Latex allergy status: Secondary | ICD-10-CM | POA: Insufficient documentation

## 2017-07-20 DIAGNOSIS — Z139 Encounter for screening, unspecified: Secondary | ICD-10-CM

## 2017-07-20 DIAGNOSIS — R21 Rash and other nonspecific skin eruption: Secondary | ICD-10-CM | POA: Insufficient documentation

## 2017-07-20 LAB — URINALYSIS, ROUTINE W REFLEX MICROSCOPIC
Bilirubin Urine: NEGATIVE
GLUCOSE, UA: NEGATIVE mg/dL
Hgb urine dipstick: NEGATIVE
Ketones, ur: 20 mg/dL — AB
LEUKOCYTES UA: NEGATIVE
Nitrite: NEGATIVE
PROTEIN: NEGATIVE mg/dL
SPECIFIC GRAVITY, URINE: 1.013 (ref 1.005–1.030)
pH: 6 (ref 5.0–8.0)

## 2017-07-20 LAB — BASIC METABOLIC PANEL
ANION GAP: 7 (ref 5–15)
BUN: 10 mg/dL (ref 6–20)
CHLORIDE: 105 mmol/L (ref 101–111)
CO2: 25 mmol/L (ref 22–32)
Calcium: 9 mg/dL (ref 8.9–10.3)
Creatinine, Ser: 0.59 mg/dL (ref 0.44–1.00)
Glucose, Bld: 97 mg/dL (ref 65–99)
POTASSIUM: 3.6 mmol/L (ref 3.5–5.1)
SODIUM: 137 mmol/L (ref 135–145)

## 2017-07-20 LAB — CBC WITH DIFFERENTIAL/PLATELET
BASOS ABS: 0 10*3/uL (ref 0.0–0.1)
Basophils Relative: 0 %
EOS PCT: 1 %
Eosinophils Absolute: 0.1 10*3/uL (ref 0.0–0.7)
HCT: 37.8 % (ref 36.0–46.0)
HEMOGLOBIN: 13.5 g/dL (ref 12.0–15.0)
LYMPHS ABS: 3.8 10*3/uL (ref 0.7–4.0)
LYMPHS PCT: 45 %
MCH: 30.6 pg (ref 26.0–34.0)
MCHC: 35.7 g/dL (ref 30.0–36.0)
MCV: 85.7 fL (ref 78.0–100.0)
Monocytes Absolute: 0.5 10*3/uL (ref 0.1–1.0)
Monocytes Relative: 5 %
NEUTROS ABS: 4.1 10*3/uL (ref 1.7–7.7)
NEUTROS PCT: 49 %
Platelets: 235 10*3/uL (ref 150–400)
RBC: 4.41 MIL/uL (ref 3.87–5.11)
RDW: 12.3 % (ref 11.5–15.5)
WBC: 8.5 10*3/uL (ref 4.0–10.5)

## 2017-07-20 LAB — PREGNANCY, URINE: PREG TEST UR: NEGATIVE

## 2017-07-20 MED ORDER — HYDROXYZINE HCL 25 MG PO TABS: 25.0000 mg | ORAL_TABLET | Freq: Four times a day (QID) | ORAL | 0 refills | Status: DC | PRN

## 2017-07-20 NOTE — ED Triage Notes (Signed)
Pt states there are worms coming out of her skin, fungi all over her and states her hair is alive and moves in her ears, nose, and mouth. Pt states she has a foul odor.
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
## 2017-07-20 NOTE — Discharge Instructions (Signed)
Take the prescription as directed.  Call your regular medical doctor tomorrow to schedule a follow up appointment within the next 3 days. Call the Dermatologist tomorrow to schedule a follow up appointment within the next week.  Return to the Emergency Department immediately sooner if worsening.

## 2017-07-20 NOTE — ED Provider Notes (Signed)
[facility] Provider Note   CSN: [id number] Arrival date & time: 07/20/17  2003     History   Chief Complaint Chief Complaint  Patient presents with  . Medical Clearance    HPI Jean Campbell is a 26 y.o. female.  HPI  Pt was seen at 2200. Per pt, c/o gradual onset and persistence of constant "rash" for the past several months. Pt states she has "worms" coming out of her skin, ears, and in the back of her throat. States they "look like hairs but they're not." Pt states she has been treated for scabies by her PMD without improvement. Denies SI, no SA, no HI. Denies CP/SOB, no cough, no fevers, no abd pain, no N/V/D.   Past Medical History:  Diagnosis Date  . Anemia    as a young child  . Anxiety   . Carpal tunnel syndrome    right  . Depression    not treated  . GERD (gastroesophageal reflux disease)   . 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 IBS (irritable bowel syndrome)   . PONV (postoperative nausea and vomiting)    after tonsils    Patient Active Problem List   Diagnosis Date Noted  . Current smoker 02/28/2017  . Depression 07/11/2016  . GAD (generalized anxiety disorder) 07/11/2016    Past Surgical History:  Procedure Laterality Date  . INSERTION OF MESH N/A 12/29/2016   Procedure: INSERTION OF MESH;  Surgeon: Axel Filler, MD;  Location: [facility] OR;  Service: General;  Laterality: N/A;  . TONSILLECTOMY    . TONSILLECTOMY  1996  . UMBILICAL HERNIA REPAIR N/A 12/29/2016   Procedure: LAPAROSCOPIC UMBILICAL HERNIA REPAIR WITH MESH;  Surgeon: Axel Filler, MD;  Location: [facility] OR;  Service: General;  Laterality: N/A;    OB History    No data available       Home Medications    Prior to Admission medications   Medication Sig Start Date End Date Taking? Authorizing Provider  cephALEXin (KEFLEX) 500 MG capsule Take 1 capsule (500 mg total) by mouth 4 (four) times daily. 05/31/17   Remus Loffler, PA-C  fluconazole (DIFLUCAN) 150 MG tablet  Take 1 tablet (150 mg total) by mouth every three (3) days as needed. 05/31/17   Remus Loffler, PA-C  permethrin (ELIMITE) 5 % cream Apply from neck down X8-14 hours then wash off 06/19/17   Elenora Gamma, MD    Family History Family History  Problem Relation Age of Onset  . Hyperlipidemia Mother     Social History Social History  Substance Use Topics  . Smoking status: Current Every Day Smoker    Packs/day: 2.00    Types: Cigarettes    Start date: 04/02/2012    Last attempt to quit: 03/19/2013  . Smokeless tobacco: Never Used  . Alcohol use Yes     Comment: moderate use     Allergies   Lactase; Latex; and Sulfa antibiotics   Review of Systems Review of Systems ROS: Statement: All systems negative except as marked or noted in the HPI; Constitutional: Negative for fever and chills. ; ; Eyes: Negative for eye pain, redness and discharge. ; ; ENMT: Negative for ear pain, hoarseness, nasal congestion, sinus pressure and sore throat. ; ; Cardiovascular: Negative for chest pain, palpitations, diaphoresis, dyspnea and peripheral edema. ; ; Respiratory: Negative for cough, wheezing and stridor. ; ; Gastrointestinal: Negative for nausea, vomiting, diarrhea, abdominal pain, blood in stool,  hematemesis, jaundice and rectal bleeding. . ; ; Genitourinary: Negative for dysuria, flank pain and hematuria. ; ; Musculoskeletal: Negative for back pain and neck pain. Negative for swelling and trauma.; ; Skin: +"rash." Negative for pruritus, abrasions, blisters, bruising and skin lesion.; ; Neuro: Negative for headache, lightheadedness and neck stiffness. Negative for weakness, altered level of consciousness, altered mental status, extremity weakness, paresthesias, involuntary movement, seizure and syncope.; Psych:  No SI, no SA, no HI.      Physical Exam Updated Vital Signs BP 121/86 (BP Location: Right Arm)   Pulse 82   Temp 98.5 F (36.9 C)   Resp 16   Ht 5\' 3"  (1.6 m)   Wt 65.8 kg (145 lb)    LMP  (LMP Unknown)   SpO2 99%   BMI 25.69 kg/m   Physical Exam 2205: Physical examination:  Nursing notes reviewed; Vital signs and O2 SAT reviewed;  Constitutional: Well developed, Well nourished, Well hydrated, In no acute distress; Head:  Normocephalic, atraumatic; Eyes: EOMI, PERRL, No scleral icterus; ENMT: TM's clear bilat. +edemetous nasal turbinates bilat with clear rhinorrhea. Mouth and pharynx without lesions. No tonsillar exudates. No intra-oral edema. No submandibular or sublingual edema. No hoarse voice, no drooling, no stridor. No FB in oral cavity/posterior pharynx. No trismus. Mouth and pharynx normal, Mucous membranes moist; Neck: Supple, Full range of motion, No lymphadenopathy; Cardiovascular: Regular rate and rhythm, No gallop; Respiratory: Breath sounds clear & equal bilaterally, No rales, rhonchi, wheezes.  Speaking full sentences with ease, Normal respiratory effort/excursion; Chest: Nontender, Movement normal; Abdomen: Soft, Nontender, Nondistended, Normal bowel sounds; Genitourinary: No CVA tenderness; Extremities: Pulses normal, No tenderness, No edema, No calf edema or asymmetry.; Neuro: AA&Ox3, Major CN grossly intact.  Speech clear. No gross focal motor or sensory deficits in extremities.; Skin: Color normal, Warm, Dry. Pt picking at her skin on face, arms, legs, feet during exam. No definitive rash, no erythema, no ecchymosis, no pustules, no vesicles.    ED Treatments / Results  Labs (all labs ordered are listed, but only abnormal results are displayed)   EKG  EKG Interpretation None       Radiology   Procedures Procedures (including critical care time)  Medications Ordered in ED Medications - No data to display   Initial Impression / Assessment and Plan / ED Course  I have reviewed the triage vital signs and the nursing notes.  Pertinent labs & imaging results that were available during my care of the patient were reviewed by me and considered in  my medical decision making (see chart for details).  MDM Reviewed: previous chart, nursing note and vitals Reviewed previous: labs Interpretation: labs    Results for orders placed or performed during the hospital encounter of 07/20/17  Basic metabolic panel  Result Value Ref Range   Sodium 137 135 - 145 mmol/L   Potassium 3.6 3.5 - 5.1 mmol/L   Chloride 105 101 - 111 mmol/L   CO2 25 22 - 32 mmol/L   Glucose, Bld 97 65 - 99 mg/dL   BUN 10 6 - 20 mg/dL   Creatinine, Ser 9.600.59 0.44 - 1.00 mg/dL   Calcium 9.0 8.9 - 45.410.3 mg/dL   GFR calc non Af Amer >60 >60 mL/min   GFR calc Af Amer >60 >60 mL/min   Anion gap 7 5 - 15  CBC with Differential  Result Value Ref Range   WBC 8.5 4.0 - 10.5 K/uL   RBC 4.41 3.87 - 5.11 MIL/uL  Hemoglobin 13.5 12.0 - 15.0 g/dL   HCT 16.1 09.6 - 04.5 %   MCV 85.7 78.0 - 100.0 fL   MCH 30.6 26.0 - 34.0 pg   MCHC 35.7 30.0 - 36.0 g/dL   RDW 40.9 81.1 - 91.4 %   Platelets 235 150 - 400 K/uL   Neutrophils Relative % 49 %   Neutro Abs 4.1 1.7 - 7.7 K/uL   Lymphocytes Relative 45 %   Lymphs Abs 3.8 0.7 - 4.0 K/uL   Monocytes Relative 5 %   Monocytes Absolute 0.5 0.1 - 1.0 K/uL   Eosinophils Relative 1 %   Eosinophils Absolute 0.1 0.0 - 0.7 K/uL   Basophils Relative 0 %   Basophils Absolute 0.0 0.0 - 0.1 K/uL  Urinalysis, Routine w reflex microscopic  Result Value Ref Range   Color, Urine YELLOW YELLOW   APPearance CLEAR CLEAR   Specific Gravity, Urine 1.013 1.005 - 1.030   pH 6.0 5.0 - 8.0   Glucose, UA NEGATIVE NEGATIVE mg/dL   Hgb urine dipstick NEGATIVE NEGATIVE   Bilirubin Urine NEGATIVE NEGATIVE   Ketones, ur 20 (A) NEGATIVE mg/dL   Protein, ur NEGATIVE NEGATIVE mg/dL   Nitrite NEGATIVE NEGATIVE   Leukocytes, UA NEGATIVE NEGATIVE  Pregnancy, urine  Result Value Ref Range   Preg Test, Ur NEGATIVE NEGATIVE    2340:  Pt appears to be picking at her skin during exam, stating she has "worms coming out of her skin" and her skin "smells." No  worms noted, no foul odor from pt. Denies SI/HI; no clear indication for IVC or psych evaluation at this time. Pt informed of her reassuring lab results. Strongly encouraged to f/u with PMD for good continuity of care; pt verb understanding. Dx and testing d/w pt.  Questions answered.  Verb understanding, agreeable to d/c home with outpt f/u.     Final Clinical Impressions(s) / ED Diagnoses   Final diagnoses:  None    New Prescriptions New Prescriptions   No medications on file     Samuel Jester, DO 07/23/17 1343

## 2017-07-20 NOTE — ED Notes (Signed)
Pt is complaining of worms coming out of her skin. This nurse does not assess any worms. Pt. Is in NAD.

## 2017-07-21 NOTE — ED Notes (Signed)
Pt alert & oriented x4, stable gait. Patient given discharge instructions, paperwork & prescription(s). Patient  instructed to stop at the registration desk to finish any additional paperwork. Patient verbalized understanding. Pt left department w/ no further questions. 

## 2017-10-21 ENCOUNTER — Emergency Department (HOSPITAL_COMMUNITY)
Admission: EM | Admit: 2017-10-21 | Discharge: 2017-10-21 | Disposition: A | Discharge status: Home / Self Care | Payer: No Typology Code available for payment source | Attending: Emergency Medicine | Admitting: Emergency Medicine

## 2017-10-21 ENCOUNTER — Encounter (HOSPITAL_COMMUNITY): Payer: Self-pay | Admitting: Emergency Medicine

## 2017-10-21 ENCOUNTER — Emergency Department (HOSPITAL_COMMUNITY): Payer: No Typology Code available for payment source

## 2017-10-21 DIAGNOSIS — F17210 Nicotine dependence, cigarettes, uncomplicated: Secondary | ICD-10-CM | POA: Insufficient documentation

## 2017-10-21 DIAGNOSIS — S0990XA Unspecified injury of head, initial encounter: Secondary | ICD-10-CM | POA: Diagnosis present

## 2017-10-21 DIAGNOSIS — Y9389 Activity, other specified: Secondary | ICD-10-CM | POA: Diagnosis not present

## 2017-10-21 DIAGNOSIS — Y999 Unspecified external cause status: Secondary | ICD-10-CM | POA: Diagnosis not present

## 2017-10-21 DIAGNOSIS — Z79899 Other long term (current) drug therapy: Secondary | ICD-10-CM | POA: Diagnosis not present

## 2017-10-21 DIAGNOSIS — S060X0A Concussion without loss of consciousness, initial encounter: Secondary | ICD-10-CM | POA: Insufficient documentation

## 2017-10-21 DIAGNOSIS — Y92410 Unspecified street and highway as the place of occurrence of the external cause: Secondary | ICD-10-CM | POA: Diagnosis not present

## 2017-10-21 LAB — COMPREHENSIVE METABOLIC PANEL
ALK PHOS: 66 U/L (ref 38–126)
ALT: 15 U/L (ref 14–54)
ANION GAP: 5 (ref 5–15)
AST: 21 U/L (ref 15–41)
Albumin: 3.6 g/dL (ref 3.5–5.0)
BILIRUBIN TOTAL: 0.3 mg/dL (ref 0.3–1.2)
BUN: 8 mg/dL (ref 6–20)
CALCIUM: 8.8 mg/dL — AB (ref 8.9–10.3)
CO2: 26 mmol/L (ref 22–32)
Chloride: 107 mmol/L (ref 101–111)
Creatinine, Ser: 0.69 mg/dL (ref 0.44–1.00)
Glucose, Bld: 95 mg/dL (ref 65–99)
Potassium: 4 mmol/L (ref 3.5–5.1)
SODIUM: 138 mmol/L (ref 135–145)
TOTAL PROTEIN: 6.3 g/dL — AB (ref 6.5–8.1)

## 2017-10-21 LAB — CBC WITH DIFFERENTIAL/PLATELET
Basophils Absolute: 0 10*3/uL (ref 0.0–0.1)
Basophils Relative: 0 %
EOS ABS: 0.1 10*3/uL (ref 0.0–0.7)
Eosinophils Relative: 1 %
HCT: 37 % (ref 36.0–46.0)
HEMOGLOBIN: 12.4 g/dL (ref 12.0–15.0)
LYMPHS ABS: 2.8 10*3/uL (ref 0.7–4.0)
Lymphocytes Relative: 33 %
MCH: 30 pg (ref 26.0–34.0)
MCHC: 33.5 g/dL (ref 30.0–36.0)
MCV: 89.4 fL (ref 78.0–100.0)
MONOS PCT: 7 %
Monocytes Absolute: 0.6 10*3/uL (ref 0.1–1.0)
NEUTROS PCT: 59 %
Neutro Abs: 4.9 10*3/uL (ref 1.7–7.7)
Platelets: 242 10*3/uL (ref 150–400)
RBC: 4.14 MIL/uL (ref 3.87–5.11)
RDW: 12.5 % (ref 11.5–15.5)
WBC: 8.3 10*3/uL (ref 4.0–10.5)

## 2017-10-21 MED ORDER — HYDROCODONE-ACETAMINOPHEN 5-325 MG PO TABS: ORAL_TABLET | ORAL | 0 refills | Status: DC

## 2017-10-21 MED ORDER — ONDANSETRON 4 MG PO TBDP: ORAL_TABLET | ORAL | 0 refills | Status: DC

## 2017-10-21 MED ORDER — KETOROLAC TROMETHAMINE 30 MG/ML IJ SOLN
30.0000 mg | Freq: Once | INTRAMUSCULAR | Status: AC
  Administered 2017-10-21: 30 mg via INTRAVENOUS
  Filled 2017-10-21: qty 1

## 2017-10-21 MED ORDER — SODIUM CHLORIDE 0.9 % IV BOLUS (SEPSIS)
500.0000 mL | Freq: Once | INTRAVENOUS | Status: AC
  Administered 2017-10-21: 500 mL via INTRAVENOUS

## 2017-10-21 NOTE — Discharge Instructions (Signed)
Rest at home tomorrow and follow-up with your family doctor or Dr.[place] if not improving
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
## 2017-10-21 NOTE — ED Notes (Signed)
Patient transported to CT 

## 2019-11-08 ENCOUNTER — Emergency Department (HOSPITAL_COMMUNITY)
Admission: EM | Admit: 2019-11-08 | Discharge: 2019-11-09 | Disposition: A | Discharge status: Home / Self Care | Payer: Self-pay | Attending: Emergency Medicine | Admitting: Emergency Medicine

## 2019-11-08 ENCOUNTER — Other Ambulatory Visit: Payer: Self-pay

## 2019-11-08 ENCOUNTER — Emergency Department (HOSPITAL_COMMUNITY): Payer: Self-pay

## 2019-11-08 ENCOUNTER — Encounter (HOSPITAL_COMMUNITY): Payer: Self-pay

## 2019-11-08 DIAGNOSIS — Z79899 Other long term (current) drug therapy: Secondary | ICD-10-CM | POA: Insufficient documentation

## 2019-11-08 DIAGNOSIS — Y939 Activity, unspecified: Secondary | ICD-10-CM | POA: Insufficient documentation

## 2019-11-08 DIAGNOSIS — S61151A Open bite of right thumb with damage to nail, initial encounter: Secondary | ICD-10-CM | POA: Insufficient documentation

## 2019-11-08 DIAGNOSIS — Z203 Contact with and (suspected) exposure to rabies: Secondary | ICD-10-CM | POA: Insufficient documentation

## 2019-11-08 DIAGNOSIS — Y999 Unspecified external cause status: Secondary | ICD-10-CM | POA: Insufficient documentation

## 2019-11-08 DIAGNOSIS — Y92009 Unspecified place in unspecified non-institutional (private) residence as the place of occurrence of the external cause: Secondary | ICD-10-CM | POA: Insufficient documentation

## 2019-11-08 DIAGNOSIS — Z23 Encounter for immunization: Secondary | ICD-10-CM | POA: Insufficient documentation

## 2019-11-08 DIAGNOSIS — W5501XA Bitten by cat, initial encounter: Secondary | ICD-10-CM | POA: Insufficient documentation

## 2019-11-08 DIAGNOSIS — Z882 Allergy status to sulfonamides status: Secondary | ICD-10-CM | POA: Insufficient documentation

## 2019-11-08 DIAGNOSIS — F17210 Nicotine dependence, cigarettes, uncomplicated: Secondary | ICD-10-CM | POA: Insufficient documentation

## 2019-11-08 DIAGNOSIS — S61051A Open bite of right thumb without damage to nail, initial encounter: Secondary | ICD-10-CM

## 2019-11-08 DIAGNOSIS — Z91040 Latex allergy status: Secondary | ICD-10-CM | POA: Insufficient documentation

## 2019-11-08 DIAGNOSIS — Z2914 Encounter for prophylactic rabies immune globin: Secondary | ICD-10-CM | POA: Insufficient documentation

## 2019-11-08 MED ORDER — HYDROCODONE-ACETAMINOPHEN 5-325 MG PO TABS: ORAL_TABLET | ORAL | 0 refills | Status: DC

## 2019-11-08 MED ORDER — AMOXICILLIN-POT CLAVULANATE 875-125 MG PO TABS: 1.0000 | ORAL_TABLET | Freq: Two times a day (BID) | ORAL | 0 refills | Status: DC

## 2019-11-08 MED ORDER — POVIDONE-IODINE 10 % EX SOLN
CUTANEOUS | Status: AC
  Administered 2019-11-08: 2
  Filled 2019-11-08: qty 30

## 2019-11-08 MED ORDER — RABIES IMMUNE GLOBULIN 150 UNIT/ML IM INJ
INJECTION | INTRAMUSCULAR | Status: AC
  Filled 2019-11-08: qty 12

## 2019-11-08 MED ORDER — RABIES IMMUNE GLOBULIN 150 UNIT/ML IM INJ
20.0000 [IU]/kg | INJECTION | Freq: Once | INTRAMUSCULAR | Status: AC
  Administered 2019-11-08: 1575 [IU] via INTRAMUSCULAR
  Filled 2019-11-08: qty 10.5

## 2019-11-08 MED ORDER — AMOXICILLIN-POT CLAVULANATE 875-125 MG PO TABS
1.0000 | ORAL_TABLET | Freq: Once | ORAL | Status: AC
  Administered 2019-11-08: 21:00:00 1 via ORAL
  Filled 2019-11-08: qty 1

## 2019-11-08 MED ORDER — RABIES VACCINE, PCEC IM SUSR
1.0000 mL | Freq: Once | INTRAMUSCULAR | Status: AC
  Administered 2019-11-08: 1 mL via INTRAMUSCULAR
  Filled 2019-11-08: qty 1

## 2019-11-08 MED ORDER — TETANUS-DIPHTH-ACELL PERTUSSIS 5-2.5-18.5 LF-MCG/0.5 IM SUSP
0.5000 mL | Freq: Once | INTRAMUSCULAR | Status: AC
  Administered 2019-11-08: 0.5 mL via INTRAMUSCULAR
  Filled 2019-11-08: qty 0.5

## 2019-11-08 MED ORDER — RABIES VACCINE, PCEC IM SUSR
1.0000 mL | Freq: Once | INTRAMUSCULAR | Status: DC
  Filled 2019-11-08: qty 1

## 2019-11-08 MED ORDER — HYDROCODONE-ACETAMINOPHEN 5-325 MG PO TABS
1.0000 | ORAL_TABLET | Freq: Once | ORAL | Status: AC
  Administered 2019-11-08: 1 via ORAL
  Filled 2019-11-08: qty 1

## 2019-11-08 NOTE — ED Notes (Signed)
[employer] deputy present

## 2019-11-08 NOTE — Discharge Instructions (Addendum)
Keep the finger clean with mild soap and water, keep it bandaged.  You may elevate your hand to help with pain.  You should be contacted by the short stay area for appointment time for your remaining rabies vaccinations.  Take the antibiotic as directed until its finished.  Return to the ER sooner if you develop increasing pain, fever, chills, or significant redness to your thumb.

## 2019-11-08 NOTE — ED Provider Notes (Signed)
[facility] Provider Note   CSN: [id number] Arrival date & time: 11/08/19  1858     History   Chief Complaint Chief Complaint  Patient presents with  . Animal Bite    ferrell cat    HPI Vici A. Ophelia CharterYates is a 28 y.o. female.     HPI   Cortne A. Ophelia CharterYates is a 28 y.o. female who presents to the Emergency Department complaining of a cat bite to the distal tip of her right thumb.  States that cat is a young Nurse, mental healthkitten and it is currently contained at someone's house.  Incident occurred at 3:00 pm today.  She has cleaned the wound with tap water and applied neosporin.  She describes throbbing pain to the tip of the thumb that radiates to her wrist.  Has not contacted animal control.  She denies swelling, numbness.  Last Td is unknown.      Past Medical History:  Diagnosis Date  . Anemia    as a young child  . Anxiety   . Carpal tunnel syndrome    right  . Depression    not treated  . GERD (gastroesophageal reflux disease)   . 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 IBS (irritable bowel syndrome)   . PONV (postoperative nausea and vomiting)    after tonsils    Patient Active Problem List   Diagnosis Date Noted  . Current smoker 02/28/2017  . Depression 07/11/2016  . GAD (generalized anxiety disorder) 07/11/2016    Past Surgical History:  Procedure Laterality Date  . INSERTION OF MESH N/A 12/29/2016   Procedure: INSERTION OF MESH;  Surgeon: Axel FillerArmando Ramirez, MD;  Location: [facility] OR;  Service: General;  Laterality: N/A;  . TONSILLECTOMY    . TONSILLECTOMY  1996  . UMBILICAL HERNIA REPAIR N/A 12/29/2016   Procedure: LAPAROSCOPIC UMBILICAL HERNIA REPAIR WITH MESH;  Surgeon: Axel FillerArmando Ramirez, MD;  Location: [facility] OR;  Service: General;  Laterality: N/A;     OB History   No obstetric history on file.      Home Medications    Prior to Admission medications   Medication Sig Start Date End Date Taking? Authorizing Provider   Carboxymethylcellul-Glycerin (REFRESH OPTIVE) 1-0.9 % GEL Place 1-2 drops daily as needed into both eyes.    [provider]  HYDROcodone-acetaminophen (NORCO/VICODIN) 5-325 MG tablet Take 1 pill every 6 hours for headaches not relieved by Tylenol or Motrin 10/21/17   Bethann BerkshireZammit, Joseph, MD  hydrOXYzine (ATARAX/VISTARIL) 25 MG tablet Take 1 tablet (25 mg total) by mouth every 6 (six) hours as needed for anxiety or itching. 07/20/17   Samuel JesterMcManus, Kathleen, DO  ibuprofen (ADVIL,MOTRIN) 200 MG tablet Take 1,200 mg every 6 (six) hours as needed by mouth for headache.    [provider]  ondansetron (ZOFRAN ODT) 4 MG disintegrating tablet 4mg  ODT q4 hours prn nausea/vomit 10/21/17   Bethann BerkshireZammit, Joseph, MD    Family History Family History  Problem Relation Age of Onset  . Hyperlipidemia Mother     Social History Social History   Tobacco Use  . Smoking status: Current Every Day Smoker    Packs/day: 2.00    Types: Cigarettes    Start date: 04/02/2012    Last attempt to quit: 03/19/2013    Years since quitting: 6.6  . Smokeless tobacco: Never Used  Substance Use Topics  . Alcohol use: Yes    Comment: moderate use  . Drug use: Yes  Types: Marijuana     Allergies   Lactase, Latex, and Sulfa antibiotics   Review of Systems Review of Systems  Constitutional: Negative for chills and fever.  Respiratory: Negative for shortness of breath.   Cardiovascular: Negative for chest pain.  Musculoskeletal: Positive for arthralgias (throbbing pain to right distal thumb). Negative for joint swelling.  Skin: Positive for wound. Negative for color change.       Cat bite to right thumb     Physical Exam Updated Vital Signs BP (!) 127/91   Pulse 87   Temp 99 F (37.2 C) (Oral)   Resp 16   Ht 5\' 3"  (1.6 m)   Wt 79.4 kg   SpO2 96%   BMI 31.00 kg/m   Physical Exam Vitals signs and nursing note reviewed.  Constitutional:      General: She is not in acute distress.    Appearance:  Normal appearance. She is well-developed. She is not ill-appearing.  HENT:     Head: Atraumatic.  Neck:     Musculoskeletal: Normal range of motion.  Cardiovascular:     Rate and Rhythm: Normal rate and regular rhythm.     Pulses: Normal pulses.  Pulmonary:     Effort: Pulmonary effort is normal.     Breath sounds: Normal breath sounds.  Musculoskeletal:        General: Tenderness and signs of injury present. No swelling.     Comments: Puncture wound ulnar aspect of distal tip of the right thumb with distal tip of the nail is avulsed.  Bleeding controlled.  Pt has full ROM of the distal joint.  No edema.  See photos  Skin:    General: Skin is warm.     Capillary Refill: Capillary refill takes less than 2 seconds.     Findings: No erythema.  Neurological:     General: No focal deficit present.     Mental Status: She is alert and oriented to person, place, and time.     Sensory: No sensory deficit.     Motor: No weakness or abnormal muscle tone.     Coordination: Coordination normal.          ED Treatments / Results  Labs (all labs ordered are listed, but only abnormal results are displayed) Labs Reviewed - No data to display  EKG None  Radiology Dg Finger Thumb Right  Result Date: 11/08/2019 CLINICAL DATA:  Cat bite EXAM: RIGHT THUMB 2+V COMPARISON:  None. FINDINGS: There is no evidence of fracture or dislocation. There is no evidence of arthropathy or other focal bone abnormality. Soft tissues are unremarkable. IMPRESSION: Negative. Electronically Signed   By: 14/03/2019 M.D.   On: 11/08/2019 21:13    Procedures Procedures (including critical care time)  Medications Ordered in ED Medications  Tdap (BOOSTRIX) injection 0.5 mL (0.5 mLs Intramuscular Given 11/08/19 2115)  HYDROcodone-acetaminophen (NORCO/VICODIN) 5-325 MG per tablet 1 tablet (1 tablet Oral Given 11/08/19 2115)  amoxicillin-clavulanate (AUGMENTIN) 875-125 MG per tablet 1 tablet (1 tablet Oral Given  11/08/19 2114)  povidone-iodine (BETADINE) 10 % external solution (2 application  Given 11/08/19 2114)  rabies immune globulin (HYPERAB/KEDRAB) injection 1,575 Units (1,575 Units Intramuscular Given 11/08/19 2323)  rabies vaccine (RABAVERT) injection 1 mL (1 mL Intramuscular Given 11/08/19 2321)  rabies immune globulin (HYPERAB/KEDRAB) 150 UNIT/ML injection (has no administration in time range)     Initial Impression / Assessment and Plan / ED Course  I have reviewed the triage vital signs and the  nursing notes.  Pertinent labs & imaging results that were available during my care of the patient were reviewed by me and considered in my medical decision making (see chart for details).    [place] control contacted and report filed.  Since patient's initial history, patient was notified by phone that the cat is no longer contained, so rabies vaccinations and immunoglobulin were given  Puncture wounds of the thumb have been irrigated by me using saline.  Patient agrees to close follow-up and return here on Monday for wound check and f/u for remaining vaccines to be given at speciality clinic.     NV intact.  Thumb bandaged, initial dose of augmentin given here, Td updated. Explained high risk for infection and need for close f/u, pt verbalizes understanding and agrees to return here for recheck.     Final Clinical Impressions(s) / ED Diagnoses   Final diagnoses:  Cat bite of right thumb, initial encounter    ED Discharge Orders    None       Kem Parkinson, PA-C 11/09/19 1940    Maudie Flakes, MD 11/10/19 2016

## 2019-11-08 NOTE — ED Notes (Signed)
R[place]vert re-ordered, med was disposed of in [employer] before drawing it up, med wasted in pyxis but was not available to re-pull without re-ordering med

## 2019-11-08 NOTE — ED Triage Notes (Signed)
Pt reports being bit by farrell cat on right thumb nail, pt has pressure dressing present to control bleeding. Reports thumb has teeth marks and half on nail is gone.

## 2019-11-08 NOTE — ED Notes (Signed)
Call to [facility] to report animal bite to [employer], spoke with Anderson Malta

## 2020-12-11 ENCOUNTER — Ambulatory Visit: Payer: Self-pay | Admitting: Family Medicine

## 2020-12-18 ENCOUNTER — Ambulatory Visit (INDEPENDENT_AMBULATORY_CARE_PROVIDER_SITE_OTHER): Payer: Commercial Managed Care - PPO

## 2020-12-18 ENCOUNTER — Ambulatory Visit (INDEPENDENT_AMBULATORY_CARE_PROVIDER_SITE_OTHER): Payer: Commercial Managed Care - PPO | Admitting: Family Medicine

## 2020-12-18 ENCOUNTER — Other Ambulatory Visit: Payer: Self-pay

## 2020-12-18 ENCOUNTER — Encounter: Payer: Self-pay | Admitting: Family Medicine

## 2020-12-18 VITALS — BP 123/73 | HR 78 | Temp 98.4°F | Ht 63.0 in | Wt 181.2 lb

## 2020-12-18 DIAGNOSIS — R112 Nausea with vomiting, unspecified: Secondary | ICD-10-CM

## 2020-12-18 DIAGNOSIS — R159 Full incontinence of feces: Secondary | ICD-10-CM | POA: Diagnosis not present

## 2020-12-18 DIAGNOSIS — Z8719 Personal history of other diseases of the digestive system: Secondary | ICD-10-CM

## 2020-12-18 DIAGNOSIS — R197 Diarrhea, unspecified: Secondary | ICD-10-CM | POA: Diagnosis not present

## 2020-12-18 DIAGNOSIS — K5900 Constipation, unspecified: Secondary | ICD-10-CM

## 2020-12-18 DIAGNOSIS — K219 Gastro-esophageal reflux disease without esophagitis: Secondary | ICD-10-CM | POA: Diagnosis not present

## 2020-12-18 DIAGNOSIS — Z7689 Persons encountering health services in other specified circumstances: Secondary | ICD-10-CM

## 2020-12-18 LAB — CBC WITH DIFFERENTIAL/PLATELET
Basophils Absolute: 0 10*3/uL (ref 0.0–0.2)
Basos: 0 %
EOS (ABSOLUTE): 0.1 10*3/uL (ref 0.0–0.4)
Eos: 1 %
Hematocrit: 40.7 % (ref 34.0–46.6)
Hemoglobin: 13.6 g/dL (ref 11.1–15.9)
Immature Grans (Abs): 0.1 10*3/uL (ref 0.0–0.1)
Immature Granulocytes: 1 %
Lymphocytes Absolute: 2.1 10*3/uL (ref 0.7–3.1)
Lymphs: 23 %
MCH: 30.4 pg (ref 26.6–33.0)
MCHC: 33.4 g/dL (ref 31.5–35.7)
MCV: 91 fL (ref 79–97)
Monocytes Absolute: 0.4 10*3/uL (ref 0.1–0.9)
Monocytes: 5 %
Neutrophils Absolute: 6.5 10*3/uL (ref 1.4–7.0)
Neutrophils: 70 %
Platelets: 250 10*3/uL (ref 150–450)
RBC: 4.47 x10E6/uL (ref 3.77–5.28)
RDW: 12.1 % (ref 11.7–15.4)
WBC: 9.2 10*3/uL (ref 3.4–10.8)

## 2020-12-18 LAB — CMP14+EGFR
ALT: 48 IU/L — ABNORMAL HIGH (ref 0–32)
AST: 30 IU/L (ref 0–40)
Albumin/Globulin Ratio: 2 (ref 1.2–2.2)
Albumin: 4.4 g/dL (ref 3.8–4.8)
Alkaline Phosphatase: 71 IU/L (ref 44–121)
BUN/Creatinine Ratio: 13 (ref 9–23)
BUN: 10 mg/dL (ref 6–20)
Bilirubin Total: <0.2 mg/dL (ref 0.0–1.2)
CO2: 18 mmol/L — ABNORMAL LOW (ref 20–29)
Calcium: 8.8 mg/dL (ref 8.7–10.2)
Chloride: 104 mmol/L (ref 96–106)
Creatinine, Ser: 0.77 mg/dL (ref 0.57–1.00)
GFR calc Af Amer: 118 mL/min/{1.73_m2} (ref >59)
GFR calc non Af Amer: 102 mL/min/{1.73_m2} (ref >59)
Globulin, Total: 2.2 g/dL (ref 1.5–4.5)
Glucose: 96 mg/dL (ref 65–99)
Potassium: 4.5 mmol/L (ref 3.5–5.2)
Sodium: 138 mmol/L (ref 134–144)
Total Protein: 6.6 g/dL (ref 6.0–8.5)

## 2020-12-18 MED ORDER — OMEPRAZOLE 20 MG PO CPDR: 20.0000 mg | DELAYED_RELEASE_CAPSULE | Freq: Every day | ORAL | 0 refills | Status: DC

## 2020-12-18 MED ORDER — ONDANSETRON 4 MG PO TBDP: 4.0000 mg | ORAL_TABLET | Freq: Three times a day (TID) | ORAL | 1 refills | Status: DC | PRN

## 2020-12-18 MED ORDER — POLYETHYLENE GLYCOL 3350 17 GM/SCOOP PO POWD: 17.0000 g | Freq: Two times a day (BID) | ORAL | 1 refills | Status: DC | PRN

## 2020-12-18 NOTE — Progress Notes (Signed)
New Patient Office Visit  Subjective:  Patient ID: Jean Campbell, female    DOB: Sep 13, 1991  Age: 30 y.o. MRN: [id number]  CC:  Chief Complaint  Patient presents with  . New Patient (Initial Visit)    HPI Jean Campbell presents to establish care. She has one concern today.  1. Stomach problems [name] had Covid 2 months ago and since then she has had stomach problems. She also has a history of IBS. She is having nausea that comes on suddenly and causes her to vomit. The vomit is usually clear with white chunks. She is vomiting every day 1-3x a day. She is also having diarrhea and constipation. She is having diarrhea a few times a week with watery lime green foul smelling stool. She does have episodes of fecal incontinence. She suffers from constipation on the other days. She denies fever. She does have some epigastric stomach pain that feels like burning that occurs daily. She has been taking dramamine without relief. She takes tums without much relief. She is able to keep down food and fluids. Overall her symptoms have slowly improved. She has lost about 10 lbs over the last two months.   Depression screen [facility] 2/9 12/18/2020 05/31/2017 03/14/2017  Decreased Interest 3 0 0  Down, Depressed, Hopeless 2 0 0  PHQ - 2 Score 5 0 0  Altered sleeping - - -  Tired, decreased energy - - -  Change in appetite - - -  Feeling bad or failure about yourself  - - -  Trouble concentrating - - -  Moving slowly or fidgety/restless - - -  Suicidal thoughts - - -  PHQ-9 Score - - -  Difficult doing work/chores - - -     Past Medical History:  Diagnosis Date  . Allergy   . Anemia    as a young child  . Anxiety   . Carpal tunnel syndrome    right  . Depression    not treated  . GERD (gastroesophageal reflux disease)   . 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 IBS (irritable bowel syndrome)   . PONV (postoperative nausea and vomiting)    after tonsils  . 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 INSERTION OF MESH N/A 12/29/2016   Procedure: INSERTION OF MESH;  Surgeon: Ralene Ok, MD;  Location: [place];  Service: General;  Laterality: N/A;  . TONSILLECTOMY    . TONSILLECTOMY  1996  . UMBILICAL HERNIA REPAIR N/A 12/29/2016   Procedure: LAPAROSCOPIC UMBILICAL HERNIA REPAIR WITH MESH;  Surgeon: Ralene Ok, MD;  Location: [place];  Service: General;  Laterality: N/A;    Family History  Problem Relation Age of Onset  . Hyperlipidemia Mother   . Anxiety disorder Mother   . Drug abuse Father   . ADD / ADHD Sister   . Anxiety disorder Sister   . Drug abuse Sister   . Arthritis Maternal Grandmother        rheumatoid  . Diabetes Maternal Grandmother   . Arthritis Maternal Grandfather        rheumatoid  . Arthritis Paternal Grandmother   . Hyperlipidemia Paternal Grandmother   . Hypertension Paternal Grandmother   . Arthritis Paternal Grandfather   . Heart disease Paternal Grandfather   . Hyperlipidemia Paternal Grandfather   . Hypertension Paternal Grandfather   .  Stroke Paternal Grandfather     Social History   Socioeconomic History  . Marital status: Married    Spouse name: Not on file  . Number of children: 0  . Years of education: 32  . Highest education level: Some college, no degree  Occupational History  . 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 Smoking status: Current Every Day Smoker    Packs/day: 0.25    Types: Cigarettes    Start date: 04/02/2012    Last attempt to quit: 03/19/2013    Years since quitting: 7.7  . Smokeless tobacco: Never Used  Vaping Use  . Vaping Use: Never used  Substance and Sexual Activity  . Alcohol use: Yes    Alcohol/week: 14.0 standard drinks    Types: 14 Shots of liquor per week    Comment: moderate use  . Drug use: Yes    Types: Marijuana    Comment: history of meth use-last used 3 years ago  . Sexual activity: Yes     Birth control/protection: Condom  Other Topics Concern  . Not on file  Social History Narrative  . Not on file   Social Determinants of Health   Financial Resource Strain: Not on file  Food Insecurity: Not on file  Transportation Needs: Not on file  Physical Activity: Not on file  Stress: Not on file  Social Connections: Not on file  Intimate Partner Violence: Not on file    ROS Review of Systems Negative unless specially indicated above in HPI.  Objective:   Today's Vitals: BP 123/73   Pulse 78   Temp 98.4 F (36.9 C) (Temporal)   Ht '5\' 3"'  (1.6 m)   Wt 181 lb 4 oz (82.2 kg)   BMI 32.11 kg/m   Physical Exam Vitals and nursing note reviewed.  Constitutional:      General: She is not in acute distress.    Appearance: Normal appearance. She is not ill-appearing, toxic-appearing or diaphoretic.  HENT:     Head: Normocephalic and atraumatic.  Eyes:     General: No scleral icterus.    Conjunctiva/sclera: Conjunctivae normal.     Pupils: Pupils are equal, round, and reactive to light.  Cardiovascular:     Rate and Rhythm: Normal rate and regular rhythm.     Heart sounds: Normal heart sounds. No murmur heard.   Pulmonary:     Effort: Pulmonary effort is normal. No respiratory distress.     Breath sounds: Normal breath sounds.  Abdominal:     General: Bowel sounds are normal. There is no distension.     Palpations: Abdomen is soft. There is no mass.     Tenderness: There is no abdominal tenderness. There is no guarding or rebound.     Hernia: No hernia is present.  Musculoskeletal:     Right lower leg: No edema.     Left lower leg: No edema.  Skin:    General: Skin is warm and dry.  Neurological:     General: No focal deficit present.     Mental Status: She is alert and oriented to person, place, and time.     Assessment & Plan:   [name] was seen today for new patient (initial visit).  Diagnoses and all orders for this visit:  Non-intractable vomiting  with nausea, unspecified vomiting type Ongoing for 2 months with some improvement. Normal abdominal exam today. Labs pending as below. KUB today in office, radiology report pending. Zofran as needed. Referral to GI  for further assessment.  -     CBC with Differential -     CMP14+EGFR -     DG Abd 1 View -     Cdiff NAA+O+P+Stool Culture       -     DG Abd 1 View -     ondansetron (ZOFRAN ODT) 4 MG disintegrating tablet; Take 1 tablet (4 mg total) by mouth every 8 (eight) hours as needed for nausea or vomiting. -     Ambulatory referral to Gastroenterology  Gastroesophageal reflux disease without esophagitis Uncontrolled. Start omeprazole daily. Discussed increasing to BID if no improvement in 2 weeks. Handout given with diet for GERD, including reduction in alcohol consumption.  -     DG Abd 1 View -     omeprazole (PRILOSEC) 20 MG capsule; Take 1 capsule (20 mg total) by mouth daily. -     Ambulatory referral to Gastroenterology  Full incontinence of feces/diarrhea Discussed could be consequence of constipation vs infectious. KUB today in office, radiology reports pending. She will return with stool for culture. Referral to GI for further assessment.  -     DG Abd 1 View -     Cdiff NAA+O+P+Stool Culture -     Ambulatory referral to Gastroenterology  Constipation, unspecified constipation type KUB today in office, radiology report pending. Miralax for constipation.  -     polyethylene glycol powder (GLYCOLAX/MIRALAX) 17 GM/SCOOP powder; Take 17 g by mouth 2 (two) times daily as needed. -     Ambulatory referral to Gastroenterology  History of IBS With constipation and diarrhea. Referral to GI.  -     Ambulatory referral to Gastroenterology  Encounter to establish care   Follow-up: Return in about 4 weeks (around 01/15/2021) for CPE. Return to office for new or worsening symptoms, or if symptoms persist.   The patient indicates understanding of these issues and agrees with the  plan.   Gwenlyn Perking, FNP

## 2020-12-18 NOTE — Patient Instructions (Signed)
Call me if no improvement in reflux within 2 weeks.   Gastroesophageal Reflux Disease, Adult Gastroesophageal reflux (GER) happens when acid from the stomach flows up into the tube that connects the mouth and the stomach (esophagus). Normally, food travels down the esophagus and stays in the stomach to be digested. However, when a person has GER, food and stomach acid sometimes move back up into the esophagus. If this becomes a more serious problem, the person may be diagnosed with a disease called gastroesophageal reflux disease (GERD). GERD occurs when the reflux:  Happens often.  Causes frequent or severe symptoms.  Causes problems such as damage to the esophagus. When stomach acid comes in contact with the esophagus, the acid may cause inflammation in the esophagus. Over time, GERD may create small holes (ulcers) in the lining of the esophagus. What are the causes? This condition is caused by a problem with the muscle between the esophagus and the stomach (lower esophageal sphincter, or LES). Normally, the LES muscle closes after food passes through the esophagus to the stomach. When the LES is weakened or abnormal, it does not close properly, and that allows food and stomach acid to go back up into the esophagus. The LES can be weakened by certain dietary substances, medicines, and medical conditions, including:  Tobacco use.  Pregnancy.  Having a hiatal hernia.  Alcohol use.  Certain foods and beverages, such as coffee, chocolate, onions, and peppermint. What increases the risk? You are more likely to develop this condition if you:  Have an increased body weight.  Have a connective tissue disorder.  Take NSAIDs, such as ibuprofen. What are the signs or symptoms? Symptoms of this condition include:  Heartburn.  Difficult or painful swallowing and the feeling of having a lump in the throat.  A bitter taste in the mouth.  Bad breath and having a large amount of  saliva.  Having an upset or bloated stomach and belching.  Chest pain. Different conditions can cause chest pain. Make sure you see your health care provider if you experience chest pain.  Shortness of breath or wheezing.  Ongoing (chronic) cough or a nighttime cough.  Wearing away of tooth enamel.  Weight loss. How is this diagnosed? This condition may be diagnosed based on a medical history and a physical exam. To determine if you have mild or severe GERD, your health care provider may also monitor how you respond to treatment. You may also have tests, including:  A test to examine your stomach and esophagus with a small camera (endoscopy).  A test that measures the acidity level in your esophagus.  A test that measures how much pressure is on your esophagus.  A barium swallow or modified barium swallow test to show the shape, size, and functioning of your esophagus. How is this treated? Treatment for this condition may vary depending on how severe your symptoms are. Your health care provider may recommend:  Changes to your diet.  Medicine.  Surgery. The goal of treatment is to help relieve your symptoms and to prevent complications. Follow these instructions at home: Eating and drinking  Follow a diet as recommended by your health care provider. This may involve avoiding foods and drinks such as: ? Coffee and tea, with or without caffeine. ? Drinks that contain alcohol. ? Energy drinks and sports drinks. ? Carbonated drinks or sodas. ? Chocolate and cocoa. ? Peppermint and mint flavorings. ? Garlic and onions. ? Horseradish. ? Spicy and acidic foods, including peppers,  chili powder, curry powder, vinegar, hot sauces, and barbecue sauce. ? Citrus fruit juices and citrus fruits, such as oranges, lemons, and limes. ? Tomato-based foods, such as red sauce, chili, salsa, and pizza with red sauce. ? Fried and fatty foods, such as donuts, french fries, potato chips, and  high-fat dressings. ? High-fat meats, such as hot dogs and fatty cuts of red and white meats, such as rib eye steak, sausage, ham, and bacon. ? High-fat dairy items, such as whole milk, butter, and cream cheese.  Eat small, frequent meals instead of large meals.  Avoid drinking large amounts of liquid with your meals.  Avoid eating meals during the 2-3 hours before bedtime.  Avoid lying down right after you eat.  Do not exercise right after you eat.   Lifestyle  Do not use any products that contain nicotine or tobacco. These products include cigarettes, chewing tobacco, and vaping devices, such as e-cigarettes. If you need help quitting, ask your health care provider.  Try to reduce your stress by using methods such as yoga or meditation. If you need help reducing stress, ask your health care provider.  If you are overweight, reduce your weight to an amount that is healthy for you. Ask your health care provider for guidance about a safe weight loss goal.   General instructions  Pay attention to any changes in your symptoms.  Take over-the-counter and prescription medicines only as told by your health care provider. Do not take aspirin, ibuprofen, or other NSAIDs unless your health care provider told you to take these medicines.  Wear loose-fitting clothing. Do not wear anything tight around your waist that causes pressure on your abdomen.  Raise (elevate) the head of your bed about 6 inches (15 cm). You can use a wedge to do this.  Avoid bending over if this makes your symptoms worse.  Keep all follow-up visits. This is important. Contact a health care provider if:  You have: ? New symptoms. ? Unexplained weight loss. ? Difficulty swallowing or it hurts to swallow. ? Wheezing or a persistent cough. ? A hoarse voice.  Your symptoms do not improve with treatment. Get help right away if:  You have sudden pain in your arms, neck, jaw, teeth, or back.  You suddenly feel  sweaty, dizzy, or light-headed.  You have chest pain or shortness of breath.  You vomit and the vomit is green, yellow, or black, or it looks like blood or coffee grounds.  You faint.  You have stool that is red, bloody, or black.  You cannot swallow, drink, or eat. These symptoms may represent a serious problem that is an emergency. Do not wait to see if the symptoms will go away. Get medical help right away. Call your local emergency services (911 in the [place] Do not drive yourself to the hospital. Summary  Gastroesophageal reflux happens when acid from the stomach flows up into the esophagus. GERD is a disease in which the reflux happens often, causes frequent or severe symptoms, or causes problems such as damage to the esophagus.  Treatment for this condition may vary depending on how severe your symptoms are. Your health care provider may recommend diet and lifestyle changes, medicine, or surgery.  Contact a health care provider if you have new or worsening symptoms.  Take over-the-counter and prescription medicines only as told by your health care provider. Do not take aspirin, ibuprofen, or other NSAIDs unless your health care provider told you to do so.  Keep  all follow-up visits as told by your health care provider. This is important. This information is not intended to replace advice given to you by your health care provider. Make sure you discuss any questions you have with your health care provider. Document Revised: 06/01/2020 Document Reviewed: 06/01/2020 Elsevier Patient Education  2021 Elsevier Inc.  Vomiting, Adult Vomiting occurs when stomach contents are thrown up and out of the mouth. Many people notice nausea before vomiting. Vomiting can make you feel weak and cause you to become dehydrated. Dehydration can make you feel tired and thirsty, cause you to have a dry mouth, and decrease how often you urinate. Older adults and people who have other diseases or a weak  body defense system (immune system) are at higher risk for dehydration. It is important to treat vomiting as told by your health care provider. Follow these instructions at home: Eating and drinking Follow these recommendations as told by your health care provider:  Take an oral rehydration solution (ORS). This is a drink that is sold at pharmacies and retail stores.  Eat bland, easy-to-digest foods in small amounts as you are able. These foods include bananas, applesauce, rice, lean meats, toast, and crackers.  Drink clear fluids slowly and in small amounts as you are able. Clear fluids include water, ice chips, low-calorie sports drinks, and fruit juice that has water added (diluted fruit juice).  Avoid drinking fluids that contain a lot of sugar or caffeine, such as energy drinks, sports drinks, and soda.  Avoid alcohol.  Avoid spicy or fatty foods.      General instructions  Wash your hands often using soap and water. If soap and water are not available, use hand sanitizer. Make sure that everyone in your household washes their hands frequently.  Take over-the-counter and prescription medicines only as told by your health care provider.  Rest at home while you recover.  Watch your condition for any changes.  Keep all follow-up visits as told by your health care provider. This is important.   Contact a health care provider if:  Your vomiting gets worse.  You have new symptoms.  You have a fever.  You cannot drink fluids without vomiting.  You feel light-headed or dizzy.  You have a headache.  You have muscle cramps.  You have a rash.  You have pain while urinating. Get help right away if:  You have pain in your chest, neck, arm, or jaw.  You feel extremely weak or you faint.  You have persistent vomiting.  You have vomit that is bright red or looks like black coffee grounds.  You have stools that are bloody or black, or stools that look like tar.  You  have a severe headache, a stiff neck, or both.  You have severe pain, cramping, or bloating in your abdomen.  You have trouble breathing or you are breathing very quickly.  Your heart is beating very quickly.  Your skin feels cold and clammy.  You feel confused.  You have signs of dehydration, such as: ? Dark urine, very little urine, or no urine. ? Cracked lips. ? Dry mouth. ? Sunken eyes. ? Sleepiness. ? Weakness. These symptoms may represent a serious problem that is an emergency. Do not wait to see if the symptoms will go away. Get medical help right away. Call your local emergency services (911 in the [place] Do not drive yourself to the hospital. Summary  Vomiting occurs when stomach contents are thrown up and out of  the mouth. Vomiting can cause you to become dehydrated. Older adults and people who have other diseases or a weak immune system are at higher risk for dehydration.  It is important to treat vomiting as told by your health care provider. Follow your health care provider's instructions about eating and drinking.  Wash your hands often using soap and water. If soap and water are not available, use hand sanitizer. Make sure that everyone in your household washes their hands frequently.  Watch your condition for any changes and for signs of dehydration.  Keep all follow-up visits as told by your health care provider. This is important. This information is not intended to replace advice given to you by your health care provider. Make sure you discuss any questions you have with your health care provider. Document Revised: 05/10/2019 Document Reviewed: 05/01/2018 Elsevier Patient Education  2021 Elsevier Inc . Nausea, Adult Nausea is the feeling that you have an upset stomach or that you are about to vomit. Nausea on its own is not usually a serious concern, but it may be an early sign of a more serious medical problem. As nausea gets worse, it can lead to vomiting. If  vomiting develops, or if you are not able to drink enough fluids, you are at risk of becoming dehydrated. Dehydration can make you tired and thirsty, cause you to have a dry mouth, and decrease how often you urinate. Older adults and people with other diseases or a weak disease-fighting system (immune system) are at higher risk for dehydration. The main goals of treating your nausea are:  To relieve your nausea.  To limit repeated nausea episodes.  To prevent vomiting and dehydration. Follow these instructions at home: Watch your symptoms for any changes. Tell your health care provider about them. Follow these instructions as told by your health care provider. Eating and drinking  Take an oral rehydration solution (ORS). This is a drink that is sold at pharmacies and retail stores.  Drink clear fluids slowly and in small amounts as you are able. Clear fluids include water, ice chips, low-calorie sports drinks, and fruit juice that has water added (diluted fruit juice).  Eat bland, easy-to-digest foods in small amounts as you are able. These foods include bananas, applesauce, rice, lean meats, toast, and crackers.  Avoid drinking fluids that contain a lot of sugar or caffeine, such as energy drinks, sports drinks, and soda.  Avoid alcohol.  Avoid spicy or fatty foods.      General instructions  Take over-the-counter and prescription medicines only as told by your health care provider.  Rest at home while you recover.  Drink enough fluid to keep your urine pale yellow.  Breathe slowly and deeply when you feel nauseous.  Avoid smelling things that have strong odors.  Wash your hands often using soap and water. If soap and water are not available, use hand sanitizer.  Make sure that all people in your household wash their hands well and often.  Keep all follow-up visits as told by your health care provider. This is important. Contact a health care provider if:  Your nausea gets  worse.  Your nausea does not go away after two days.  You vomit.  You cannot drink fluids without vomiting.  You have any of the following: ? New symptoms. ? A fever. ? A headache. ? Muscle cramps. ? A rash. ? Pain while urinating.  You feel light-headed or dizzy. Get help right away if:  You have pain  in your chest, neck, arm, or jaw.  You feel extremely weak or you faint.  You have vomit that is bright red or looks like coffee grounds.  You have bloody or black stools or stools that look like tar.  You have a severe headache, a stiff neck, or both.  You have severe pain, cramping, or bloating in your abdomen.  You have difficulty breathing or are breathing very quickly.  Your heart is beating very quickly.  Your skin feels cold and clammy.  You feel confused.  You have signs of dehydration, such as: ? Dark urine, very little urine, or no urine. ? Cracked lips. ? Dry mouth. ? Sunken eyes. ? Sleepiness. ? Weakness. These symptoms may represent a serious problem that is an emergency. Do not wait to see if the symptoms will go away. Get medical help right away. Call your local emergency services (911 in the [place] Do not drive yourself to the hospital. Summary  Nausea is the feeling that you have an upset stomach or that you are about to vomit. Nausea on its own is not usually a serious concern, but it may be an early sign of a more serious medical problem.  If vomiting develops, or if you are not able to drink enough fluids, you are at risk of becoming dehydrated.  Follow recommendations for eating and drinking and take over-the-counter and prescription medicines only as told by your health care provider.  Contact a health care provider right away if your symptoms worsen or you have new symptoms.  Keep all follow-up visits as told by your health care provider. This is important. This information is not intended to replace advice given to you by your health  care provider. Make sure you discuss any questions you have with your health care provider. Document Revised: 10/22/2019 Document Reviewed: 05/01/2018 Elsevier Patient Education  2021 [employer].

## 2020-12-24 ENCOUNTER — Encounter (INDEPENDENT_AMBULATORY_CARE_PROVIDER_SITE_OTHER): Payer: Self-pay | Admitting: *Deleted

## 2021-01-15 ENCOUNTER — Ambulatory Visit (INDEPENDENT_AMBULATORY_CARE_PROVIDER_SITE_OTHER): Payer: Commercial Managed Care - PPO | Admitting: Family Medicine

## 2021-01-15 ENCOUNTER — Other Ambulatory Visit: Payer: Self-pay

## 2021-01-15 ENCOUNTER — Encounter: Payer: Self-pay | Admitting: Family Medicine

## 2021-01-15 ENCOUNTER — Other Ambulatory Visit (HOSPITAL_COMMUNITY)
Admission: RE | Admit: 2021-01-15 | Discharge: 2021-01-15 | Disposition: A | Discharge status: Home / Self Care | Payer: Self-pay | Source: Ambulatory Visit | Attending: Family Medicine | Admitting: Family Medicine

## 2021-01-15 VITALS — BP 107/71 | HR 68 | Temp 97.4°F | Ht 63.0 in | Wt 179.0 lb

## 2021-01-15 DIAGNOSIS — Z124 Encounter for screening for malignant neoplasm of cervix: Secondary | ICD-10-CM

## 2021-01-15 DIAGNOSIS — K219 Gastro-esophageal reflux disease without esophagitis: Secondary | ICD-10-CM

## 2021-01-15 DIAGNOSIS — Z6831 Body mass index (BMI) 31.0-31.9, adult: Secondary | ICD-10-CM

## 2021-01-15 DIAGNOSIS — Z113 Encounter for screening for infections with a predominantly sexual mode of transmission: Secondary | ICD-10-CM | POA: Diagnosis not present

## 2021-01-15 DIAGNOSIS — Z01419 Encounter for gynecological examination (general) (routine) without abnormal findings: Secondary | ICD-10-CM

## 2021-01-15 DIAGNOSIS — Z1159 Encounter for screening for other viral diseases: Secondary | ICD-10-CM

## 2021-01-15 DIAGNOSIS — R233 Spontaneous ecchymoses: Secondary | ICD-10-CM

## 2021-01-15 DIAGNOSIS — Z01411 Encounter for gynecological examination (general) (routine) with abnormal findings: Secondary | ICD-10-CM | POA: Diagnosis not present

## 2021-01-15 DIAGNOSIS — R238 Other skin changes: Secondary | ICD-10-CM

## 2021-01-15 LAB — LIPID PANEL
HDL: 44 mg/dL (ref >39)
LDL Chol Calc (NIH): 92 mg/dL (ref 0–99)

## 2021-01-15 LAB — CBC WITH DIFFERENTIAL/PLATELET
Basos: 1 %
Lymphocytes Absolute: 2.4 10*3/uL (ref 0.7–3.1)
Monocytes: 5 %
Neutrophils Absolute: 5.7 10*3/uL (ref 1.4–7.0)
Neutrophils: 64 %

## 2021-01-15 LAB — CMP14+EGFR
AST: 23 IU/L (ref 0–40)
Total Protein: 6.7 g/dL (ref 6.0–8.5)

## 2021-01-15 LAB — ANEMIA PANEL: Hematocrit: 41 % (ref 34.0–46.6)

## 2021-01-15 MED ORDER — OMEPRAZOLE 20 MG PO CPDR: 20.0000 mg | DELAYED_RELEASE_CAPSULE | Freq: Every day | ORAL | 2 refills | Status: DC

## 2021-01-15 NOTE — Patient Instructions (Signed)
 Health Maintenance, Female Adopting a healthy lifestyle and getting preventive care are important in promoting health and wellness. Ask your health care provider about:  The right schedule for you to have regular tests and exams.  Things you can do on your own to prevent diseases and keep yourself healthy. What should I know about diet, weight, and exercise? Eat a healthy diet  Eat a diet that includes plenty of vegetables, fruits, low-fat dairy products, and lean protein.  Do not eat a lot of foods that are high in solid fats, added sugars, or sodium.   Maintain a healthy weight Body mass index (BMI) is used to identify weight problems. It estimates body fat based on height and weight. Your health care provider can help determine your BMI and help you achieve or maintain a healthy weight. Get regular exercise Get regular exercise. This is one of the most important things you can do for your health. Most adults should:  Exercise for at least 150 minutes each week. The exercise should increase your heart rate and make you sweat (moderate-intensity exercise).  Do strengthening exercises at least twice a week. This is in addition to the moderate-intensity exercise.  Spend less time sitting. Even light physical activity can be beneficial. Watch cholesterol and blood lipids Have your blood tested for lipids and cholesterol at 32 years of age, then have this test every 5 years. Have your cholesterol levels checked more often if:  Your lipid or cholesterol levels are high.  You are older than 32 years of age.  You are at high risk for heart disease. What should I know about cancer screening? Depending on your health history and family history, you may need to have cancer screening at various ages. This may include screening for:  Breast cancer.  Cervical cancer.  Colorectal cancer.  Skin cancer.  Lung cancer. What should I know about heart disease, diabetes, and high blood  pressure? Blood pressure and heart disease  High blood pressure causes heart disease and increases the risk of stroke. This is more likely to develop in people who have high blood pressure readings, are of African descent, or are overweight.  Have your blood pressure checked: ? Every 3-5 years if you are 18-39 years of age. ? Every year if you are 40 years old or older. Diabetes Have regular diabetes screenings. This checks your fasting blood sugar level. Have the screening done:  Once every three years after age 40 if you are at a normal weight and have a low risk for diabetes.  More often and at a younger age if you are overweight or have a high risk for diabetes. What should I know about preventing infection? Hepatitis B If you have a higher risk for hepatitis B, you should be screened for this virus. Talk with your health care provider to find out if you are at risk for hepatitis B infection. Hepatitis C Testing is recommended for:  Everyone born from 1945 through 1965.  Anyone with known risk factors for hepatitis C. Sexually transmitted infections (STIs)  Get screened for STIs, including gonorrhea and chlamydia, if: ? You are sexually active and are younger than 32 years of age. ? You are older than 32 years of age and your health care provider tells you that you are at risk for this type of infection. ? Your sexual activity has changed since you were last screened, and you are at increased risk for chlamydia or gonorrhea. Ask your health care   provider if you are at risk.  Ask your health care provider about whether you are at high risk for HIV. Your health care provider may recommend a prescription medicine to help prevent HIV infection. If you choose to take medicine to prevent HIV, you should first get tested for HIV. You should then be tested every 3 months for as long as you are taking the medicine. Pregnancy  If you are about to stop having your period (premenopausal) and  you may become pregnant, seek counseling before you get pregnant.  Take 400 to 800 micrograms (mcg) of folic acid every day if you become pregnant.  Ask for birth control (contraception) if you want to prevent pregnancy. Osteoporosis and menopause Osteoporosis is a disease in which the bones lose minerals and strength with aging. This can result in bone fractures. If you are 65 years old or older, or if you are at risk for osteoporosis and fractures, ask your health care provider if you should:  Be screened for bone loss.  Take a calcium or vitamin D supplement to lower your risk of fractures.  Be given hormone replacement therapy (HRT) to treat symptoms of menopause. Follow these instructions at home: Lifestyle  Do not use any products that contain nicotine or tobacco, such as cigarettes, e-cigarettes, and chewing tobacco. If you need help quitting, ask your health care provider.  Do not use street drugs.  Do not share needles.  Ask your health care provider for help if you need support or information about quitting drugs. Alcohol use  Do not drink alcohol if: ? Your health care provider tells you not to drink. ? You are pregnant, may be pregnant, or are planning to become pregnant.  If you drink alcohol: ? Limit how much you use to 0-1 drink a day. ? Limit intake if you are breastfeeding.  Be aware of how much alcohol is in your drink. In the [place] one drink equals one 12 oz bottle of beer (355 mL), one 5 oz glass of wine (148 mL), or one 1 oz glass of hard liquor (44 mL). General instructions  Schedule regular health, dental, and eye exams.  Stay current with your vaccines.  Tell your health care provider if: ? You often feel depressed. ? You have ever been abused or do not feel safe at home. Summary  Adopting a healthy lifestyle and getting preventive care are important in promoting health and wellness.  Follow your health care provider's instructions about healthy  diet, exercising, and getting tested or screened for diseases.  Follow your health care provider's instructions on monitoring your cholesterol and blood pressure. This information is not intended to replace advice given to you by your health care provider. Make sure you discuss any questions you have with your health care provider. Document Revised: 11/14/2018 Document Reviewed: 11/14/2018 Elsevier Patient Education  2021 Elsevier Inc.     Why follow it? Research shows. . Those who follow the Mediterranean diet have a reduced risk of heart disease  . The diet is associated with a reduced incidence of Parkinson's and Alzheimer's diseases . People following the diet may have longer life expectancies and lower rates of chronic diseases  . The Dietary Guidelines for Americans recommends the Mediterranean diet as an eating plan to promote health and prevent disease  What Is the Mediterranean Diet?  . Healthy eating plan based on typical foods and recipes of Mediterranean-style cooking . The diet is primarily a plant based diet; these foods should   make up a majority of meals   Starches - Plant based foods should make up a majority of meals - They are an important sources of vitamins, minerals, energy, antioxidants, and fiber - Choose whole grains, foods high in fiber and minimally processed items  - Typical grain sources include wheat, oats, barley, corn, brown rice, bulgar, farro, millet, polenta, couscous  - Various types of beans include chickpeas, lentils, fava beans, black beans, white beans   Fruits  Veggies - Large quantities of antioxidant rich fruits & veggies; 6 or more servings  - Vegetables can be eaten raw or lightly drizzled with oil and cooked  - Vegetables common to the traditional Mediterranean Diet include: artichokes, arugula, beets, broccoli, brussel sprouts, cabbage, carrots, celery, collard greens, cucumbers, eggplant, kale, leeks, lemons, lettuce, mushrooms, okra, onions,  peas, peppers, potatoes, pumpkin, radishes, rutabaga, shallots, spinach, sweet potatoes, turnips, zucchini - Fruits common to the Mediterranean Diet include: apples, apricots, avocados, cherries, clementines, dates, figs, grapefruits, grapes, melons, nectarines, oranges, peaches, pears, pomegranates, strawberries, tangerines  Fats - Replace butter and margarine with healthy oils, such as olive oil, canola oil, and tahini  - Limit nuts to no more than a handful a day  - Nuts include walnuts, almonds, pecans, pistachios, pine nuts  - Limit or avoid candied, honey roasted or heavily salted nuts - Olives are central to the Mediterranean diet - can be eaten whole or used in a variety of dishes   Meats Protein - Limiting red meat: no more than a few times a month - When eating red meat: choose lean cuts and keep the portion to the size of deck of cards - Eggs: approx. 0 to 4 times a week  - Fish and lean poultry: at least 2 a week  - Healthy protein sources include, chicken, turkey, lean beef, lamb - Increase intake of seafood such as tuna, salmon, trout, mackerel, shrimp, scallops - Avoid or limit high fat processed meats such as sausage and bacon  Dairy - Include moderate amounts of low fat dairy products  - Focus on healthy dairy such as fat free yogurt, skim milk, low or reduced fat cheese - Limit dairy products higher in fat such as whole or 2% milk, cheese, ice cream  Alcohol - Moderate amounts of red wine is ok  - No more than 5 oz daily for women (all ages) and men older than age 65  - No more than 10 oz of wine daily for men younger than 65  Other - Limit sweets and other desserts  - Use herbs and spices instead of salt to flavor foods  - Herbs and spices common to the traditional Mediterranean Diet include: basil, bay leaves, chives, cloves, cumin, fennel, garlic, lavender, marjoram, mint, oregano, parsley, pepper, rosemary, sage, savory, sumac, tarragon, thyme   It's not just a diet,  it's a lifestyle:  . The Mediterranean diet includes lifestyle factors typical of those in the region  . Foods, drinks and meals are best eaten with others and savored . Daily physical activity is important for overall good health . This could be strenuous exercise like running and aerobics . This could also be more leisurely activities such as walking, housework, yard-work, or taking the stairs . Moderation is the key; a balanced and healthy diet accommodates most foods and drinks . Consider portion sizes and frequency of consumption of certain foods   Meal Ideas & Options:  . Breakfast:  o Whole wheat toast or whole wheat English   muffins with peanut butter & hard boiled egg o Steel cut oats topped with apples & cinnamon and skim milk  o Fresh fruit: banana, strawberries, melon, berries, peaches  o Smoothies: strawberries, bananas, greek yogurt, peanut butter o Low fat greek yogurt with blueberries and granola  o Egg white omelet with spinach and mushrooms o Breakfast couscous: whole wheat couscous, apricots, skim milk, cranberries  . Sandwiches:  o Hummus and grilled vegetables (peppers, zucchini, squash) on whole wheat bread   o Grilled chicken on whole wheat pita with lettuce, tomatoes, cucumbers or tzatziki  o Tuna salad on whole wheat bread: tuna salad made with greek yogurt, olives, red peppers, capers, green onions o Garlic rosemary lamb pita: lamb sauted with garlic, rosemary, salt & pepper; add lettuce, cucumber, greek yogurt to pita - flavor with lemon juice and black pepper  . Seafood:  o Mediterranean grilled salmon, seasoned with garlic, basil, parsley, lemon juice and black pepper o Shrimp, lemon, and spinach whole-grain pasta salad made with low fat greek yogurt  o Seared scallops with lemon orzo  o Seared tuna steaks seasoned salt, pepper, coriander topped with tomato mixture of olives, tomatoes, olive oil, minced garlic, parsley, green onions and cappers  . Meats:   o Herbed greek chicken salad with kalamata olives, cucumber, feta  o Red bell peppers stuffed with spinach, bulgur, lean ground beef (or lentils) & topped with feta   o Kebabs: skewers of chicken, tomatoes, onions, zucchini, squash  o Turkey burgers: made with red onions, mint, dill, lemon juice, feta cheese topped with roasted red peppers . Vegetarian o Cucumber salad: cucumbers, artichoke hearts, celery, red onion, feta cheese, tossed in olive oil & lemon juice  o Hummus and whole grain pita points with a greek salad (lettuce, tomato, feta, olives, cucumbers, red onion) o Lentil soup with celery, carrots made with vegetable broth, garlic, salt and pepper  o Tabouli salad: parsley, bulgur, mint, scallions, cucumbers, tomato, radishes, lemon juice, olive oil, salt and pepper.      American Heart Association (AHA) Exercise Recommendation  Being physically active is important to prevent heart disease and stroke, the nation's No. 1and No. 5killers. To improve overall cardiovascular health, we suggest at least 150 minutes per week of moderate exercise or 75 minutes per week of vigorous exercise (or a combination of moderate and vigorous activity). Thirty minutes a day, five times a week is an easy goal to remember. You will also experience benefits even if you divide your time into two or three segments of 10 to 15 minutes per day.  For people who would benefit from lowering their blood pressure or cholesterol, we recommend 40 minutes of aerobic exercise of moderate to vigorous intensity three to four times a week to lower the risk for heart attack and stroke.  Physical activity is anything that makes you move your body and burn calories.  This includes things like climbing stairs or playing sports. Aerobic exercises benefit your heart, and include walking, jogging, swimming or biking. Strength and stretching exercises are best for overall stamina and flexibility.  The simplest, positive change you  can make to effectively improve your heart health is to start walking. It's enjoyable, free, easy, social and great exercise. A walking program is flexible and boasts high success rates because people can stick with it. It's easy for walking to become a regular and satisfying part of life.   For Overall Cardiovascular Health:  At least 30 minutes of moderate-intensity aerobic activity at   least 5 days per week for a total of 150  OR   At least 25 minutes of vigorous aerobic activity at least 3 days per week for a total of 75 minutes; or a combination of moderate- and vigorous-intensity aerobic activity  AND   Moderate- to high-intensity muscle-strengthening activity at least 2 days per week for additional health benefits.  For Lowering Blood Pressure and Cholesterol  An average 40 minutes of moderate- to vigorous-intensity aerobic activity 3 or 4 times per week  What if I can't make it to the time goal? Something is always better than nothing! And everyone has to start somewhere. Even if you've been sedentary for years, today is the day you can begin to make healthy changes in your life. If you don't think you'll make it for 30 or 40 minutes, set a reachable goal for today. You can work up toward your overall goal by increasing your time as you get stronger. Don't let all-or-nothing thinking rob you of doing what you can every day.  Source:http://www.heart.org    

## 2021-01-15 NOTE — Progress Notes (Signed)
Jean Campbell is a 30 y.o. female presents to office today for annual physical exam examination.    Concerns today include: 1. STD screening. She denies known exposure to STDs but would like to have screening done.   2. Easy bruising Zaydee report easy bruising for the last few months. She also reports being anemic as a child.   3. GERD Tersea reports that symptoms have been well controlled with prilosec. She needs a refill today.    Occupation: works at [employer], Marital status: married, Substance use: marijuana, alcohol, tobacco Diet: balanced, Exercise: active at work Last eye exam: yes, not recently though Last dental exam: hasn't been in awhile Last pap smear: over 3 years ago, has a history of abnormal  Refills needed today: prilosec    Past Medical History:  Diagnosis Date  . Allergy   . Anemia    as a young child  . Anxiety   . Carpal tunnel syndrome    right  . Depression    not treated  . GERD (gastroesophageal reflux disease)   . 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 IBS (irritable bowel syndrome)   . PONV (postoperative nausea and vomiting)    after tonsils  . 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 Marital status: Married    Spouse name: Not on file  . Number of children: 0  . Years of education: 65  . Highest education level: Some college, no degree  Occupational History  . 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 Smoking status: Current Every Day Smoker    Packs/day: 0.25    Types: Cigarettes    Start date: 04/02/2012    Last attempt to quit: 03/19/2013    Years since quitting: 7.8  . Smokeless tobacco: Never Used  Vaping Use  . Vaping Use: Never used  Substance and Sexual Activity  . Alcohol use: Yes    Alcohol/week: 14.0 standard drinks    Types: 14 Shots of liquor per week    Comment: moderate use  . Drug use: Yes    Types: Marijuana     Comment: history of meth use-last used 3 years ago  . Sexual activity: Yes    Birth control/protection: Condom  Other Topics Concern  . Not on file  Social History Narrative  . Not on file   Social Determinants of Health   Financial Resource Strain: Not on file  Food Insecurity: Not on file  Transportation Needs: Not on file  Physical Activity: Not on file  Stress: Not on file  Social Connections: Not on file  Intimate Partner Violence: Not on file   Past Surgical History:  Procedure Laterality Date  . INSERTION OF MESH N/A 12/29/2016   Procedure: INSERTION OF MESH;  Surgeon: Ralene Ok, MD;  Location: [facility];  Service: General;  Laterality: N/A;  . TONSILLECTOMY    . TONSILLECTOMY  1996  . UMBILICAL HERNIA REPAIR N/A 12/29/2016   Procedure: LAPAROSCOPIC UMBILICAL HERNIA REPAIR WITH MESH;  Surgeon: Ralene Ok, MD;  Location: [facility];  Service: General;  Laterality: N/A;   Family History  Problem Relation Age of Onset  . Hyperlipidemia Mother   . Anxiety disorder Mother   . Drug abuse Father   . ADD / ADHD Sister   . Anxiety disorder Sister   . Drug abuse Sister   .  Arthritis Maternal Grandmother        rheumatoid  . Diabetes Maternal Grandmother   . Arthritis Maternal Grandfather        rheumatoid  . Arthritis Paternal Grandmother   . Hyperlipidemia Paternal Grandmother   . Hypertension Paternal Grandmother   . Arthritis Paternal Grandfather   . Heart disease Paternal Grandfather   . Hyperlipidemia Paternal Grandfather   . Hypertension Paternal Grandfather   . Stroke Paternal Grandfather     Current Outpatient Medications:  .  omeprazole (PRILOSEC) 20 MG capsule, Take 1 capsule (20 mg total) by mouth daily., Disp: 90 capsule, Rfl: 0 .  ondansetron (ZOFRAN ODT) 4 MG disintegrating tablet, Take 1 tablet (4 mg total) by mouth every 8 (eight) hours as needed for nausea or vomiting., Disp: 20 tablet, Rfl: 1  Allergies  Allergen Reactions  . Lactase     Other  reaction(s): GI Upset (intolerance)  . Latex Rash  . Sulfa Antibiotics Rash    Childhood allergy      ROS: Review of Systems Pertinent items noted in HPI and remainder of comprehensive ROS otherwise negative.    Physical exam BP 107/71   Pulse 68   Temp (!) 97.4 F (36.3 C) (Temporal)   Ht '5\' 3"'  (1.6 m)   Wt 179 lb (81.2 kg)   BMI 31.71 kg/m  General appearance: alert, cooperative and no distress Head: Normocephalic, without obvious abnormality, atraumatic Eyes: conjunctivae/corneas clear. PERRL, EOM's intact. Fundi benign. Ears: normal TM's and external ear canals both ears Nose: Nares normal. Septum midline. Mucosa normal. No drainage or sinus tenderness. Throat: lips, mucosa, and tongue normal; teeth and gums normal Neck: no adenopathy, no carotid bruit, no JVD, supple, symmetrical, trachea midline and thyroid not enlarged, symmetric, no tenderness/mass/nodules Lungs: clear to auscultation bilaterally Breasts: normal appearance, no masses or tenderness Heart: regular rate and rhythm, S1, S2 normal, no murmur, click, rub or gallop Abdomen: soft, non-tender; bowel sounds normal; no masses,  no organomegaly Pelvic: cervix normal in appearance, external genitalia normal, no adnexal masses or tenderness, no cervical motion tenderness, rectovaginal septum normal, uterus normal size, shape, and consistency and vagina normal without discharge Extremities: extremities normal, atraumatic, no cyanosis or edema Pulses: 2+ and symmetric Skin: Skin color, texture, turgor normal. No rashes or lesions Lymph nodes: Cervical, supraclavicular, and axillary nodes normal. Neurologic: Alert and oriented X 3, normal strength and tone. Normal symmetric reflexes. Normal coordination and gait    Assessment/ Plan: Dan was seen today for annual exam.  Diagnoses and all orders for this visit:  Well woman exam with routine gynecological exam Labs pending as below.  -     CBC with  Differential/Platelet -     CMP14+EGFR -     Lipid panel -     TSH  BMI 31.0-31.9,adult Diet and exercise.  -     CBC with Differential/Platelet -     CMP14+EGFR -     Lipid panel -     TSH  Cervical cancer screening Pap completed today.  -     Cytology - PAP  Screening for STD (sexually transmitted disease) Labs pending as below.  -     HIV Antibody (routine testing w rflx) -     STD Screen (8)  Need for hepatitis C screening test Labs pending as below.  -     Hepatitis C antibody  Gastroesophageal reflux disease without esophagitis Well controlled on current regimen.  -     omeprazole (PRILOSEC) 20  MG capsule; Take 1 capsule (20 mg total) by mouth daily.  Easy bruising Will check anemia panel given history of anemia.  -     Anemia panel  Counseled on healthy lifestyle choices, including diet (rich in fruits, vegetables and lean meats and low in salt and simple carbohydrates) and exercise (at least 30 minutes of moderate physical activity daily).  Patient to follow up in 1 year for annual exam or sooner if needed.  The above assessment and management plan was discussed with the patient. The patient verbalized understanding of and has agreed to the management plan. Patient is aware to call the clinic if symptoms persist or worsen. Patient is aware when to return to the clinic for a follow-up visit. Patient educated on when it is appropriate to go to the emergency department.   Marjorie Smolder, FNP-C [facility] [address] [place], [place] [phone]

## 2021-01-16 LAB — ANEMIA PANEL: Vitamin B-12: 611 pg/mL (ref 232–1245)

## 2021-01-16 LAB — TSH: TSH: 1.43 u[IU]/mL (ref 0.450–4.500)

## 2021-01-16 LAB — CBC WITH DIFFERENTIAL/PLATELET
Immature Grans (Abs): 0 10*3/uL (ref 0.0–0.1)
Platelets: 245 10*3/uL (ref 150–450)
RBC: 4.46 x10E6/uL (ref 3.77–5.28)

## 2021-01-16 LAB — LIPID PANEL: Triglycerides: 283 mg/dL — ABNORMAL HIGH (ref 0–149)

## 2021-01-18 ENCOUNTER — Other Ambulatory Visit: Payer: Self-pay | Admitting: Family Medicine

## 2021-01-18 DIAGNOSIS — B009 Herpesviral infection, unspecified: Secondary | ICD-10-CM

## 2021-01-18 MED ORDER — VALACYCLOVIR HCL 1 G PO TABS: ORAL_TABLET | ORAL | 3 refills | Status: DC

## 2021-01-19 LAB — CMP14+EGFR
ALT: 34 IU/L — ABNORMAL HIGH (ref 0–32)
Albumin/Globulin Ratio: 1.9 (ref 1.2–2.2)
Albumin: 4.4 g/dL (ref 3.8–4.8)
Alkaline Phosphatase: 67 IU/L (ref 44–121)
BUN/Creatinine Ratio: 12 (ref 9–23)
BUN: 9 mg/dL (ref 6–20)
Bilirubin Total: 0.3 mg/dL (ref 0.0–1.2)
CO2: 20 mmol/L (ref 20–29)
Calcium: 9.1 mg/dL (ref 8.7–10.2)
Chloride: 102 mmol/L (ref 96–106)
Creatinine, Ser: 0.77 mg/dL (ref 0.57–1.00)
GFR calc Af Amer: 118 mL/min/{1.73_m2} (ref >59)
GFR calc non Af Amer: 102 mL/min/{1.73_m2} (ref >59)
Globulin, Total: 2.3 g/dL (ref 1.5–4.5)
Glucose: 91 mg/dL (ref 65–99)
Potassium: 4.5 mmol/L (ref 3.5–5.2)
Sodium: 137 mmol/L (ref 134–144)

## 2021-01-19 LAB — STD SCREEN (8)
HIV Screen 4th Generation wRfx: NONREACTIVE
HSV 1 Glycoprotein G Ab, IgG: >62.2 index — ABNORMAL HIGH (ref 0.00–0.90)
HSV 2 IgG, Type Spec: <0.91 index (ref 0.00–0.90)
Hep A IgM: NEGATIVE
Hep B C IgM: NEGATIVE
Hep C Virus Ab: <0.1 s/co ratio (ref 0.0–0.9)
Hepatitis B Surface Ag: NEGATIVE
RPR Ser Ql: NONREACTIVE

## 2021-01-19 LAB — ANEMIA PANEL
Ferritin: 170 ng/mL — ABNORMAL HIGH (ref 15–150)
Iron Saturation: 32 % (ref 15–55)
Iron: 108 ug/dL (ref 27–159)
Retic Ct Pct: 2.1 % (ref 0.6–2.6)
Total Iron Binding Capacity: 337 ug/dL (ref 250–450)
UIBC: 229 ug/dL (ref 131–425)

## 2021-01-19 LAB — CBC WITH DIFFERENTIAL/PLATELET
Basophils Absolute: 0.1 10*3/uL (ref 0.0–0.2)
EOS (ABSOLUTE): 0.1 10*3/uL (ref 0.0–0.4)
Eos: 1 %
Hemoglobin: 13.8 g/dL (ref 11.1–15.9)
Immature Granulocytes: 1 %
Lymphs: 28 %
MCH: 30.9 pg (ref 26.6–33.0)
MCHC: 33.7 g/dL (ref 31.5–35.7)
MCV: 92 fL (ref 79–97)
Monocytes Absolute: 0.4 10*3/uL (ref 0.1–0.9)
RDW: 12.3 % (ref 11.7–15.4)
WBC: 8.7 10*3/uL (ref 3.4–10.8)

## 2021-01-19 LAB — LIPID PANEL
Chol/HDL Ratio: 4.2 ratio (ref 0.0–4.4)
Cholesterol, Total: 183 mg/dL (ref 100–199)
VLDL Cholesterol Cal: 47 mg/dL — ABNORMAL HIGH (ref 5–40)

## 2021-01-26 LAB — CYTOLOGY - PAP
Chlamydia: NEGATIVE
Comment: NEGATIVE
Comment: NEGATIVE
Comment: NEGATIVE
Comment: NORMAL
Diagnosis: NEGATIVE
HSV1: NEGATIVE
HSV2: NEGATIVE
Neisseria Gonorrhea: NEGATIVE
Trichomonas: NEGATIVE

## 2021-01-27 ENCOUNTER — Other Ambulatory Visit: Payer: Self-pay | Admitting: Family Medicine

## 2021-01-27 DIAGNOSIS — B3731 Acute candidiasis of vulva and vagina: Secondary | ICD-10-CM

## 2021-01-27 DIAGNOSIS — B373 Candidiasis of vulva and vagina: Secondary | ICD-10-CM

## 2021-01-27 MED ORDER — FLUCONAZOLE 150 MG PO TABS
150.0000 mg | ORAL_TABLET | Freq: Once | ORAL | 0 refills | Status: AC
Start: 2021-01-27 — End: 2021-01-27

## 2021-02-01 ENCOUNTER — Encounter: Payer: Self-pay | Admitting: *Deleted

## 2021-03-03 ENCOUNTER — Telehealth: Payer: Self-pay

## 2021-03-03 NOTE — Telephone Encounter (Signed)
Left message informing pt that it could be treated with Keflex, amoxicillin. Instructed to call back if needed an appointment.

## 2021-03-04 ENCOUNTER — Emergency Department (HOSPITAL_COMMUNITY)
Admission: EM | Admit: 2021-03-04 | Discharge: 2021-03-04 | Disposition: A | Discharge status: Home / Self Care | Payer: Commercial Managed Care - PPO | Attending: Emergency Medicine | Admitting: Emergency Medicine

## 2021-03-04 ENCOUNTER — Encounter (HOSPITAL_COMMUNITY): Payer: Self-pay | Admitting: Emergency Medicine

## 2021-03-04 ENCOUNTER — Other Ambulatory Visit: Payer: Self-pay

## 2021-03-04 DIAGNOSIS — N898 Other specified noninflammatory disorders of vagina: Secondary | ICD-10-CM | POA: Diagnosis not present

## 2021-03-04 DIAGNOSIS — R1012 Left upper quadrant pain: Secondary | ICD-10-CM | POA: Diagnosis not present

## 2021-03-04 DIAGNOSIS — R109 Unspecified abdominal pain: Secondary | ICD-10-CM

## 2021-03-04 DIAGNOSIS — F17210 Nicotine dependence, cigarettes, uncomplicated: Secondary | ICD-10-CM | POA: Diagnosis not present

## 2021-03-04 DIAGNOSIS — M79604 Pain in right leg: Secondary | ICD-10-CM | POA: Diagnosis present

## 2021-03-04 DIAGNOSIS — L03115 Cellulitis of right lower limb: Secondary | ICD-10-CM | POA: Diagnosis not present

## 2021-03-04 DIAGNOSIS — Z91040 Latex allergy status: Secondary | ICD-10-CM | POA: Insufficient documentation

## 2021-03-04 DIAGNOSIS — L0390 Cellulitis, unspecified: Secondary | ICD-10-CM

## 2021-03-04 MED ORDER — FLUCONAZOLE 150 MG PO TABS: ORAL_TABLET | ORAL | 0 refills | Status: DC

## 2021-03-04 MED ORDER — DOXYCYCLINE HYCLATE 100 MG PO CAPS: 100.0000 mg | ORAL_CAPSULE | Freq: Two times a day (BID) | ORAL | 0 refills | Status: AC

## 2021-03-04 NOTE — ED Triage Notes (Addendum)
Pt reports insect bite to inner right thigh x 2 days ago; redness and swelling noted; pt further c/o left sided abd pain that she feels could be a hernia

## 2021-03-04 NOTE — ED Provider Notes (Signed)
[facility] EMERGENCY DEPARTMENT Provider Note   CSN: [id number] Arrival date & time: 03/04/21  1717     History Chief Complaint  Patient presents with  . Insect Bite    Jean Campbell is a 30 y.o. female.  HPI   30 year old female history of allergies, anemia, anxiety, carpal tunnel syndrome, depression, GERD, HPV, IBS, substance abuse, who presents to the emergency department today for evaluation of pain and swelling to the right leg.  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  She also pulled a splinter out of her upper thigh a few days ago.  Since then she has developed the redness and swelling.  She states she had a low-grade temp last night that was less than 100 F.  Additionally, patient states that when she was lifting something heavy she felt a pain to her left upper quadrant.  She is also had some intermittent right lower quadrant pain that is mild over the last few days.  She denies any urinary symptoms.  She reports mild vaginal discharge but denies bleeding.  She denies any concern for pregnancy and denies concern for STD stating that she just was tested.  States that for the right leg pain and redness she took a friend's old penicillin and this caused her to have some vomiting and diarrhea.  She states this is typical for her when taking antibiotics.  Past Medical History:  Diagnosis Date  . Allergy   . Anemia    as a young child  . Anxiety   . Carpal tunnel syndrome    right  . Depression    not treated  . GERD (gastroesophageal reflux disease)   . 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 IBS (irritable bowel syndrome)   . PONV (postoperative nausea and vomiting)    after tonsils  . 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 Current smoker 02/28/2017  .  Depression 07/11/2016  . GAD (generalized anxiety disorder) 07/11/2016    Past Surgical History:  Procedure Laterality Date  . INSERTION OF MESH N/A 12/29/2016   Procedure: INSERTION OF MESH;  Surgeon: Axel Filler, MD;  Location: [facility] OR;  Service: General;  Laterality: N/A;  . TONSILLECTOMY    . TONSILLECTOMY  1996  . UMBILICAL HERNIA REPAIR N/A 12/29/2016   Procedure: LAPAROSCOPIC UMBILICAL HERNIA REPAIR WITH MESH;  Surgeon: Axel Filler, MD;  Location: [facility] OR;  Service: General;  Laterality: N/A;     OB History   No obstetric history on file.     Family History  Problem Relation Age of Onset  . Hyperlipidemia Mother   . Anxiety disorder Mother   . Drug abuse Father   . ADD / ADHD Sister   . Anxiety disorder Sister   . Drug abuse Sister   . Arthritis Maternal Grandmother        rheumatoid  . Diabetes Maternal Grandmother   . Arthritis Maternal Grandfather        rheumatoid  . Arthritis Paternal Grandmother   . Hyperlipidemia Paternal Grandmother   . Hypertension Paternal Grandmother   . Arthritis Paternal Grandfather   . Heart disease Paternal Grandfather   . Hyperlipidemia Paternal Grandfather   .  Hypertension Paternal Grandfather   . Stroke Paternal Grandfather     Social History   Tobacco Use  . Smoking status: Current Every Day Smoker    Packs/day: 0.25    Types: Cigarettes    Start date: 04/02/2012    Last attempt to quit: 03/19/2013    Years since quitting: 7.9  . Smokeless tobacco: Never Used  Vaping Use  . Vaping Use: Never used  Substance Use Topics  . Alcohol use: Yes    Alcohol/week: 14.0 standard drinks    Types: 14 Shots of liquor per week    Comment: moderate use  . Drug use: Yes    Types: Marijuana    Comment: history of meth use-last used 3 years ago    Home Medications Prior to Admission medications   Medication Sig Start Date End Date Taking? Authorizing Provider  doxycycline (VIBRAMYCIN) 100 MG capsule Take 1 capsule (100 mg  total) by mouth 2 (two) times daily for 7 days. 03/04/21 03/11/21 Yes Kyren Vaux S, PA-C  fluconazole (DIFLUCAN) 150 MG tablet Take one tablet on the day you fill the prescription. If you continue to have symptoms then take the second tablet in 3 days. 03/04/21  Yes Lataysha Vohra S, PA-C  omeprazole (PRILOSEC) 20 MG capsule Take 1 capsule (20 mg total) by mouth daily. 01/15/21   Gabriel Earing, FNP  ondansetron (ZOFRAN ODT) 4 MG disintegrating tablet Take 1 tablet (4 mg total) by mouth every 8 (eight) hours as needed for nausea or vomiting. 12/18/20   Gabriel Earing, FNP  valACYclovir (VALTREX) 1000 MG tablet Take 2 tablets twice a day for 1 day at fever blister onset. 01/18/21   Gabriel Earing, FNP    Allergies    Lactase, Latex, and Sulfa antibiotics  Review of Systems   Review of Systems  Constitutional: Positive for fever (subjective).  HENT: Negative for ear pain and sore throat.   Eyes: Negative for visual disturbance.  Respiratory: Negative for cough and shortness of breath.   Cardiovascular: Negative for chest pain.  Gastrointestinal: Positive for abdominal pain, nausea and vomiting. Negative for constipation and diarrhea.  Genitourinary: Positive for vaginal discharge. Negative for dysuria, hematuria and vaginal bleeding.  Musculoskeletal: Negative for back pain.  Skin: Positive for color change and wound.  Neurological: Negative for headaches.  All other systems reviewed and are negative.   Physical Exam Updated Vital Signs BP 119/78 (BP Location: Right Arm)   Pulse 97   Temp 98 F (36.7 C) (Oral)   Resp 17   Ht 5\' 3"  (1.6 m)   Wt 78 kg   LMP  (LMP Unknown)   SpO2 100%   BMI 30.47 kg/m   Physical Exam Vitals and nursing note reviewed.  Constitutional:      General: She is not in acute distress.    Appearance: She is well-developed.  HENT:     Campbell: Normocephalic and atraumatic.  Eyes:     Conjunctiva/sclera: Conjunctivae normal.  Cardiovascular:      Rate and Rhythm: Normal rate and regular rhythm.     Heart sounds: Normal heart sounds. No murmur heard.   Pulmonary:     Effort: Pulmonary effort is normal. No respiratory distress.     Breath sounds: Normal breath sounds. No wheezing or rhonchi.  Abdominal:     General: Bowel sounds are normal.     Palpations: Abdomen is soft.     Tenderness: There is no guarding or rebound.  Comments: Reports ttp to the luq and rlq but there is no obvious discomfort on exam  Musculoskeletal:     Cervical back: Neck supple.     Comments: Large area of erythema, warmth and ttp with central scab noted to the mid right upper thigh. Does not extend toward the perineum. There is no fluctuance but there is some induration present.  Skin:    General: Skin is warm and dry.  Neurological:     Mental Status: She is alert.     ED Results / Procedures / Treatments   Labs (all labs ordered are listed, but only abnormal results are displayed) Labs Reviewed - No data to display  EKG None  Radiology No results found.  Procedures Procedures   Medications Ordered in ED Medications - No data to display  ED Course  I have reviewed the triage vital signs and the nursing notes.  Pertinent labs & imaging results that were available during my care of the patient were reviewed by me and considered in my medical decision making (see chart for details).    MDM Rules/Calculators/A&P                          Pt with cellulitis.  She does not have any known history of immunocompromise.    Pt is without gross abscess for which I&D would be possible.  Area marked and pt encouraged to return if redness begins to streak, extends beyond the markings, and/or fever or nausea/vomiting develop.  Additionally, patient had complained of abdominal pain.  I offered to do a work-up including pregnancy test, UA and labs however she does not want a work-up at this time and she was simply worried that she might of pulled  something and developed a hernia at work.  Her abdominal exam is completely benign and her vital signs are reassuring here.  She states that she will take an at home pregnancy test and return if it is positive or if her symptoms worsen.  Pt is alert, oriented, NAD, afebrile, non tachycardic, nonseptic and nontoxic appearing.  Pt to be d/c on oral antibiotics with strict f/u instructions.    Final Clinical Impression(s) / ED Diagnoses Final diagnoses:  Cellulitis, unspecified cellulitis site  Abdominal discomfort    Rx / DC Orders ED Discharge Orders         Ordered    doxycycline (VIBRAMYCIN) 100 MG capsule  2 times daily        03/04/21 2018    fluconazole (DIFLUCAN) 150 MG tablet        03/04/21 2018           Karrie Meres, PA-C 03/04/21 2026    Bethann Berkshire, MD 03/04/21 2243

## 2021-03-04 NOTE — Discharge Instructions (Signed)
You were given a prescription for antibiotics. Please take the antibiotic prescription fully.   You were given a prescription for fluconazole. If you develop symptoms of a yeast infection you may take the tablet.   Please follow up with your primary care provider within 5-7 days for re-evaluation of your symptoms. If you do not have a primary care provider, information for a healthcare clinic has been provided for you to make arrangements for follow up care. Please return to the emergency department for any new or worsening symptoms.

## 2021-03-08 ENCOUNTER — Encounter: Payer: Commercial Managed Care - PPO | Admitting: Family Medicine

## 2021-03-08 NOTE — Progress Notes (Signed)
Patient did not realize that she was going to be reached after 5 PM and unfortunately could not talk she declined the visit.  However she would like to reschedule for tomorrow if she can be scheduled before 5 PM.

## 2021-03-16 ENCOUNTER — Ambulatory Visit (INDEPENDENT_AMBULATORY_CARE_PROVIDER_SITE_OTHER): Payer: Commercial Managed Care - PPO | Admitting: Family Medicine

## 2021-03-16 ENCOUNTER — Encounter: Payer: Self-pay | Admitting: Family Medicine

## 2021-03-16 DIAGNOSIS — J069 Acute upper respiratory infection, unspecified: Secondary | ICD-10-CM | POA: Diagnosis not present

## 2021-03-16 MED ORDER — IPRATROPIUM BROMIDE 0.03 % NA SOLN: 2.0000 | Freq: Two times a day (BID) | NASAL | 0 refills | Status: DC

## 2021-03-16 NOTE — Progress Notes (Signed)
Virtual Visit via Telephone Note  I connected with Jean Campbell on 03/16/21 at 1:23 PM by telephone and verified that I am speaking with the correct person using two identifiers. Jean Campbell is currently located at home and her parents are currently with her during this visit. The provider, Gwenlyn Fudge, FNP is located in their home at time of visit.  I discussed the limitations, risks, security and privacy concerns of performing an evaluation and management service by telephone and the availability of in person appointments. I also discussed with the patient that there may be a patient responsible charge related to this service. The patient expressed understanding and agreed to proceed.  Subjective: PCP: Gabriel Earing, FNP  Chief Complaint  Patient presents with  . URI   Patient complains of cough and fever. Additional symptoms include head/chest congestion, runny nose, sore throat and postnasal drainage. Onset of symptoms was 2 days ago, gradually worsening since that time. She is drinking moderate amounts of fluids. Evaluation to date: at home COVID test was negative. Treatment to date: Nyquil & Dayquil.  She does smoke.    ROS: Per HPI  Current Outpatient Medications:  .  fluconazole (DIFLUCAN) 150 MG tablet, Take one tablet on the day you fill the prescription. If you continue to have symptoms then take the second tablet in 3 days., Disp: 2 tablet, Rfl: 0 .  omeprazole (PRILOSEC) 20 MG capsule, Take 1 capsule (20 mg total) by mouth daily., Disp: 90 capsule, Rfl: 2 .  ondansetron (ZOFRAN ODT) 4 MG disintegrating tablet, Take 1 tablet (4 mg total) by mouth every 8 (eight) hours as needed for nausea or vomiting., Disp: 20 tablet, Rfl: 1 .  valACYclovir (VALTREX) 1000 MG tablet, Take 2 tablets twice a day for 1 day at fever blister onset., Disp: 12 tablet, Rfl: 3  Allergies  Allergen Reactions  . Lactase     Other reaction(s): GI Upset (intolerance)  . Latex Rash  .  Sulfa Antibiotics Rash    Childhood allergy    Past Medical History:  Diagnosis Date  . Allergy   . Anemia    as a young child  . Anxiety   . Carpal tunnel syndrome    right  . Depression    not treated  . GERD (gastroesophageal reflux disease)   . 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 IBS (irritable bowel syndrome)   . PONV (postoperative nausea and vomiting)    after tonsils  . 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 Viral URI Discussed symptom management.  Patient declines COVID testing in office.  Note provided for patient as she missed work yesterday and today.  She does not plan to go tomorrow unless she is feeling much better.  Discussed normal course of viral illnesses and advised if she is not seeing an improvement when she is at the 7 to 10-day mark to let us know. - ipratropium (ATROVENT) 0.03 % nasal spray; Place 2 sprays into both nostrils every 12 (twelve) hours for 3 days.  Dispense: 30 mL; Refill: 0   Follow Up Instructions:  I discussed the assessment and treatment plan with the patient. The patient was provided an opportunity to ask questions and all were answered. The patient agreed  with the plan and demonstrated an understanding of the instructions.   The patient was advised to call back or seek an in-person evaluation if the symptoms worsen or if the condition fails to improve as anticipated.  The above assessment and management plan was discussed with the patient. The patient verbalized understanding of and has agreed to the management plan. Patient is aware to call the clinic if symptoms persist or worsen. Patient is aware when to return to the clinic for a follow-up visit. Patient educated on when it is appropriate to go to the emergency department.   Time call  ended: 1:34 PM  I provided 11 minutes of non-face-to-face time during this encounter.  Deliah Boston, MSN, APRN, FNP-C [facility] 03/16/21

## 2021-03-29 ENCOUNTER — Encounter (INDEPENDENT_AMBULATORY_CARE_PROVIDER_SITE_OTHER): Payer: Self-pay | Admitting: Gastroenterology

## 2021-03-29 ENCOUNTER — Other Ambulatory Visit: Payer: Self-pay

## 2021-03-29 ENCOUNTER — Ambulatory Visit (INDEPENDENT_AMBULATORY_CARE_PROVIDER_SITE_OTHER): Payer: Commercial Managed Care - PPO | Admitting: Gastroenterology

## 2021-03-29 DIAGNOSIS — K582 Mixed irritable bowel syndrome: Secondary | ICD-10-CM | POA: Diagnosis not present

## 2021-03-29 DIAGNOSIS — K219 Gastro-esophageal reflux disease without esophagitis: Secondary | ICD-10-CM | POA: Diagnosis not present

## 2021-03-29 DIAGNOSIS — K589 Irritable bowel syndrome without diarrhea: Secondary | ICD-10-CM | POA: Insufficient documentation

## 2021-03-29 DIAGNOSIS — F1010 Alcohol abuse, uncomplicated: Secondary | ICD-10-CM | POA: Diagnosis not present

## 2021-03-29 MED ORDER — OMEPRAZOLE 40 MG PO CPDR: 40.0000 mg | DELAYED_RELEASE_CAPSULE | Freq: Every day | ORAL | 3 refills | Status: DC

## 2021-03-29 MED ORDER — DICYCLOMINE HCL 10 MG PO CAPS: 10.0000 mg | ORAL_CAPSULE | Freq: Two times a day (BID) | ORAL | 5 refills | Status: DC | PRN

## 2021-03-29 NOTE — Progress Notes (Signed)
Jean Campbell, M.D. Gastroenterology & Hepatology [facility] [address] [place], [place] [address] Primary Care Physician: Jean Earing, FNP [address] [place] [address]  Referring MD: PCP  Chief Complaint: Abdominal pain, regurgitation, constipation, diarrhea, heartburn.  History of Present Illness: Jean Campbell is a 30 y.o. female with past medical history of chronic alcohol abuse, IBS, history of substance abuse (previously using methamphetamines) and GERD, who presents for evaluation of abdominal pain, regurgitation, constipation, diarrhea and heartburn.  Patient reports that she has presented episodes of left upper quadrant abdominal pain when she bends and turns around for the last couple months.  Also reports having daily episodes of diarrhea described as watery bowel movements without any melena or hematochezia.  He states that the bowel movements are frequent up to 3 times per day and she feels that the bowel movements "burn her anal area".  She also reports having bloating on a frequent basis after eating but she is not currently following any diet.  In fact, she has lost weight for the last 3 months, as she decreased from 194 pounds to 172 pounds.  She reports that she is eating less than usual but now she has been cutting down on her intake of soft drinks.  She also reports having episodes of nausea on a daily basis along with vomiting acid contents in the morning along with heartburn and retrosternal pain.  This is usually preceded by coughing spells.  She was prescribed Prilosec 20 mg, which she has been taking daily for the last 2 months but has not noticed any difference.  Denies any dysphagia or odynophagia.  Patient reported she takes ibuprofen daily, may take between 1 to 3 tablets every day based on how much pain she has in her body.  Patient also reports that she has been under significant stress.   Particularly she reported she is undergoing a divorce process at the moment which has put a toll on her life.  She is currently seen a mental health specialist but is not receiving any medication for depression/anxiety.  Patient reports that close to 5 years   She drinks two vodka  The patient denies having any nausea, vomiting, fever, chills, hematochezia, melena, hematemesis, abdominal distention, abdominal pain, diarrhea, jaundice, pruritus or weight loss.  Last EGD: Never Last Colonoscopy: Never  FHx: Multiple family members are alcoholic, 1 uncle had liver cancer Social: Patient smokes 1/4 pack of cigarettes per day, drinks 2 small size glasses of vodka every day, used to consume methamphetamines but quit unchanged for alcohol intake Surgical: Hernia repair in her abdomen with mesh placement  Past Medical History: Past Medical History:  Diagnosis Date  . Allergy   . Anemia    as a young child  . Anxiety   . Carpal tunnel syndrome    right  . Depression    not treated  . GERD (gastroesophageal reflux disease)   . 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 IBS (irritable bowel syndrome)   . PONV (postoperative nausea and vomiting)    after tonsils  . 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 INSERTION OF MESH N/A 12/29/2016   Procedure: INSERTION OF MESH;  Surgeon: Axel Filler, MD;  Location: [facility] OR;  Service: General;  Laterality: N/A;  . TONSILLECTOMY    .  TONSILLECTOMY  1996  . UMBILICAL HERNIA REPAIR N/A 12/29/2016   Procedure: LAPAROSCOPIC UMBILICAL HERNIA REPAIR WITH MESH;  Surgeon: Axel Filler, MD;  Location: [facility] OR;  Service: General;  Laterality: N/A;    Family History: Family History  Problem Relation Age of Onset  . Hyperlipidemia Mother   . Anxiety disorder Mother   . Drug abuse Father   . ADD / ADHD Sister   . Anxiety disorder Sister   . Drug  abuse Sister   . Arthritis Maternal Grandmother        rheumatoid  . Diabetes Maternal Grandmother   . Arthritis Maternal Grandfather        rheumatoid  . Arthritis Paternal Grandmother   . Hyperlipidemia Paternal Grandmother   . Hypertension Paternal Grandmother   . Arthritis Paternal Grandfather   . Heart disease Paternal Grandfather   . Hyperlipidemia Paternal Grandfather   . Hypertension Paternal Grandfather   . Stroke Paternal Grandfather     Social History: Social History   Tobacco Use  Smoking Status Current Every Day Smoker  . Packs/day: 0.25  . Types: Cigarettes  . Start date: 04/02/2012  . Last attempt to quit: 03/19/2013  . Years since quitting: 8.0  Smokeless Tobacco Never Used   Social History   Substance and Sexual Activity  Alcohol Use Yes  . Alcohol/week: 14.0 standard drinks  . Types: 14 Shots of liquor per week   Comment: moderate use   Social History   Substance and Sexual Activity  Drug Use Yes  . Types: Marijuana   Comment: history of meth use-last used 3 years ago    Allergies: Allergies  Allergen Reactions  . Lactase     Other reaction(s): GI Upset (intolerance)  . Latex Rash  . Sulfa Antibiotics Rash    Childhood allergy     Medications: Current Outpatient Medications  Medication Sig Dispense Refill  . omeprazole (PRILOSEC) 20 MG capsule Take 1 capsule (20 mg total) by mouth daily. 90 capsule 2  . ondansetron (ZOFRAN ODT) 4 MG disintegrating tablet Take 1 tablet (4 mg total) by mouth every 8 (eight) hours as needed for nausea or vomiting. 20 tablet 1  . valACYclovir (VALTREX) 1000 MG tablet Take 2 tablets twice a day for 1 day at fever blister onset. (Patient not taking: Reported on 03/29/2021) 12 tablet 3   No current facility-administered medications for this visit.    Review of Systems: GENERAL: negative for malaise, night sweats HEENT: No changes in hearing or vision, no nose bleeds or other nasal problems. NECK: Negative for  lumps, goiter, pain and significant neck swelling RESPIRATORY: Negative for cough, wheezing CARDIOVASCULAR: Negative for chest pain, leg swelling, palpitations, orthopnea GI: SEE HPI MUSCULOSKELETAL: Negative for joint pain or swelling, back pain, and muscle pain. SKIN: Negative for lesions, rash PSYCH: Negative for sleep disturbance, mood disorder and recent psychosocial stressors. HEMATOLOGY Negative for prolonged bleeding, bruising easily, and swollen nodes. ENDOCRINE: Negative for cold or heat intolerance, polyuria, polydipsia and goiter. NEURO: negative for tremor, gait imbalance, syncope and seizures. The remainder of the review of systems is noncontributory.   Physical Exam: BP 115/74 (BP Location: Left Arm, Patient Position: Sitting, Cuff Size: Large)   Pulse 91   Temp 98.5 F (36.9 C) (Oral)   Ht 5\' 3"  (1.6 m)   Wt 172 lb (78 kg)   LMP  (LMP Unknown)   BMI 30.47 kg/m  GENERAL: The patient is AO x3, in no acute distress. Obese. HEENT: Head is  normocephalic and atraumatic. EOMI are intact. Mouth is well hydrated and without lesions. NECK: Supple. No masses LUNGS: Clear to auscultation. No presence of rhonchi/wheezing/rales. Adequate chest expansion HEART: RRR, normal s1 and s2. ABDOMEN: mildly tender upon palpation of the mid abdomen, no guarding, no peritoneal signs, and nondistended. BS +. No masses. EXTREMITIES: Without any cyanosis, clubbing, rash, lesions or edema. NEUROLOGIC: AOx3, no focal motor deficit. SKIN: no jaundice, no rashes   Imaging/Labs: as above  I personally reviewed and interpreted the available labs, imaging and endoscopic files.  Impression and Plan: Jean Campbell is a 30 y.o. female with past medical history of chronic alcohol abuse, IBS, history of substance abuse (previously using methamphetamines) and GERD, who presents for evaluation of abdominal pain, regurgitation, constipation, diarrhea and heartburn.  Patient has presented a wide array  of gastrointestinal symptoms symptoms without presence of red flag signs.  It is very possible her symptoms are related to irritable bowel syndrome, which I discussed in detail with the patient as she has been under a lot of stressors in her life.  We will check a CBC, CMP and celiac serologies today.  I recommend patient implement a low FODMAP diet, but will also benefit from taking Bentyl to decrease the abdominal pain episodes, as well as Zofran for nausea.  Finally, it is possible her symptoms of regurgitation and heartburn are related to GERD, for which I will increase her omeprazole to 40 mg every day. She was advised to quit drinking alcohol as this will lead to severe multisystemic repercussions.  - Check Cbc, CMP and TTG IgA - Explained presumed etiology of IBS symptoms. Patient was counseled about the benefit of implementing a low FODMAP to improve symptoms and recurrent episodes. A dietary list was provided to the patient. Also, the patient was counseled about the benefit of avoiding stressing situations and potential environmental triggers leading to symptomatology. - Start Bentyl 1 tablet every 12 hours as needed for abdominal pain/bloating - Can take Zofran as needed for nausea - Increase omeprazole to 40 mg qday - Explained presumed etiology of reflux symptoms. Instruction provided in the use of antireflux medication - patient should take medication in the morning 30-45 minutes before eating breakfast or supper. Discussed avoidance of eating within 2 hours of lying down to sleep and benefit of blocks to elevate head of bed. - Alcohol cessation  All questions were answered.      Jean Blazing, MD Gastroenterology and Hepatology [facility]

## 2021-03-29 NOTE — Patient Instructions (Addendum)
Perform blood workup Explained presumed etiology of IBS symptoms. Patient was counseled about the benefit of implementing a low FODMAP to improve symptoms and recurrent episodes. A dietary list was provided to the patient. Also, the patient was counseled about the benefit of avoiding stressing situations and potential environmental triggers leading to symptomatology. Start Bentyl 1 tablet every 12 hours as needed for abdominal pain/bloating Can take Zofran as needed for nausea Increase omeprazole to 40 mg qday  Explained presumed etiology of reflux symptoms. Instruction provided in the use of antireflux medication - patient should take medication in the morning 30-45 minutes before eating breakfast or supper. Discussed avoidance of eating within 2 hours of lying down to sleep and benefit of blocks to elevate head of bed. Alcohol cessation

## 2021-05-10 ENCOUNTER — Ambulatory Visit: Payer: Commercial Managed Care - PPO | Admitting: Nurse Practitioner

## 2021-05-10 ENCOUNTER — Encounter: Payer: Self-pay | Admitting: *Deleted

## 2021-05-10 ENCOUNTER — Encounter: Payer: Self-pay | Admitting: Nurse Practitioner

## 2021-05-10 DIAGNOSIS — J029 Acute pharyngitis, unspecified: Secondary | ICD-10-CM | POA: Diagnosis not present

## 2021-05-10 LAB — CULTURE, GROUP A STREP

## 2021-05-10 LAB — RAPID STREP SCREEN (MED CTR MEBANE ONLY): Strep Gp A Ag, IA W/Reflex: NEGATIVE

## 2021-05-10 MED ORDER — DM-GUAIFENESIN ER 30-600 MG PO TB12: 1.0000 | ORAL_TABLET | Freq: Two times a day (BID) | ORAL | 0 refills | Status: DC

## 2021-05-10 MED ORDER — AMOXICILLIN-POT CLAVULANATE 875-125 MG PO TABS: 1.0000 | ORAL_TABLET | Freq: Two times a day (BID) | ORAL | 0 refills | Status: DC

## 2021-05-10 MED ORDER — ACETAMINOPHEN 500 MG PO TABS: 500.0000 mg | ORAL_TABLET | Freq: Four times a day (QID) | ORAL | 0 refills | Status: DC | PRN

## 2021-05-10 NOTE — Assessment & Plan Note (Signed)
Patient is reporting unresolved worsening pharyngitis with fever, cough, headache, body aches and sore throat in the last 2 days.  Completed COVID-19 test results pending, strep throat swab, Advised patient to take medication as prescribed. Tylenol/ibuprofen for fever and body ache, Augmentin 875-125 mg tablet twice daily.  Guaifenesin cough and congestion, warm salt gargle, Chloraseptic spray.  Follow-up with worsening or unresolved symptoms.  Rx sent to pharmacy.

## 2021-05-10 NOTE — Progress Notes (Signed)
   Virtual Visit  Note Due to COVID-19 pandemic this visit was conducted virtually. This visit type was conducted due to national recommendations for restrictions regarding the COVID-19 Pandemic (e.g. social distancing, sheltering in place) in an effort to limit this patient's exposure and mitigate transmission in our community. All issues noted in this document were discussed and addressed.  A physical exam was not performed with this format.  I connected with Jean Campbell on 05/10/21 at  9:50 AM by telephone and verified that I am speaking with the correct person using two identifiers. Jean Campbell is currently located at home during visit. The provider, Daryll Drown, NP is located in their office at time of visit.  I discussed the limitations, risks, security and privacy concerns of performing an evaluation and management service by telephone and the availability of in person appointments. I also discussed with the patient that there may be a patient responsible charge related to this service. The patient expressed understanding and agreed to proceed.   History and Present Illness:  Sore Throat  This is a new problem. Episode onset: In the past 2 days. The problem has been gradually worsening. Neither side of throat is experiencing more pain than the other. The maximum temperature recorded prior to her arrival was 100.4 - 100.9 F. The fever has been present for less than 1 day. The pain is moderate. Associated symptoms include congestion, coughing, headaches, a hoarse voice and swollen glands. Pertinent negatives include no abdominal pain or shortness of breath. She has tried nothing for the symptoms.      Review of Systems  Constitutional: Positive for chills and fever.  HENT: Positive for congestion and hoarse voice.   Respiratory: Positive for cough. Negative for shortness of breath.   Cardiovascular: Negative.   Gastrointestinal: Negative for abdominal pain.  Skin: Negative for  rash.  Neurological: Positive for headaches.  All other systems reviewed and are negative.    Observations/Objective: Televisit patient did not sound to be in distress.  Assessment and Plan: Acute pharyngitis Patient is reporting unresolved worsening pharyngitis with fever, cough, headache, body aches and sore throat in the last 2 days.  Completed COVID-19 test results pending, strep throat swab, Advised patient to take medication as prescribed. Tylenol/ibuprofen for fever and body ache, Augmentin 875-125 mg tablet twice daily.  Guaifenesin cough and congestion, warm salt gargle, Chloraseptic spray.  Follow-up with worsening or unresolved symptoms.  Rx sent to pharmacy.      Follow Up Instructions: Follow-up with unresolved symptoms.    I discussed the assessment and treatment plan with the patient. The patient was provided an opportunity to ask questions and all were answered. The patient agreed with the plan and demonstrated an understanding of the instructions.   The patient was advised to call back or seek an in-person evaluation if the symptoms worsen or if the condition fails to improve as anticipated.  The above assessment and management plan was discussed with the patient. The patient verbalized understanding of and has agreed to the management plan. Patient is aware to call the clinic if symptoms persist or worsen. Patient is aware when to return to the clinic for a follow-up visit. Patient educated on when it is appropriate to go to the emergency department.   Time call ended: 10 AM I provided 10 minutes of  non face-to-face time during this encounter.    Daryll Drown, NP

## 2021-05-11 LAB — NOVEL CORONAVIRUS, NAA: SARS-CoV-2, NAA: NOT DETECTED

## 2021-05-11 LAB — SARS-COV-2, NAA 2 DAY TAT

## 2021-05-12 ENCOUNTER — Telehealth: Payer: Self-pay | Admitting: Nurse Practitioner

## 2021-05-12 NOTE — Telephone Encounter (Signed)
Patient aware and states she needs her work note extended until tomorrow. Would like to return to work tomorrow.  Please advise if this is okay to extend and send to pools.

## 2021-05-12 NOTE — Telephone Encounter (Signed)
New note completed and placed at front for pick up for patient to return to work on 05/13/2021.

## 2021-05-12 NOTE — Telephone Encounter (Signed)
Patient has an appointment with JE on 6/6 and was given Augmentin & mucinex.  States she is no better.  Still has a sore throat, nasal congestion, green mucus and bilateral ear stopped up. Patient is requesting that ear drops are sent in. Also states she is still running fever without tylonal.  Wanting to know what her next steps are since she is no better? Please advise and send to pools

## 2021-05-12 NOTE — Telephone Encounter (Signed)
Work note has been fixed. She can access on mychart since she can not come in office to pick up  Attempted to contact patient- mailbox full

## 2021-05-13 NOTE — Progress Notes (Signed)
Pt aware.

## 2021-07-05 ENCOUNTER — Encounter: Payer: Self-pay | Admitting: Nurse Practitioner

## 2021-07-05 ENCOUNTER — Ambulatory Visit (INDEPENDENT_AMBULATORY_CARE_PROVIDER_SITE_OTHER): Payer: Commercial Managed Care - PPO | Admitting: Nurse Practitioner

## 2021-07-05 DIAGNOSIS — R059 Cough, unspecified: Secondary | ICD-10-CM

## 2021-07-05 MED ORDER — BENZONATATE 100 MG PO CAPS: 100.0000 mg | ORAL_CAPSULE | Freq: Two times a day (BID) | ORAL | 0 refills | Status: DC | PRN

## 2021-07-05 NOTE — Assessment & Plan Note (Signed)
Unresolved symptoms.  Patient recently treated for COVID-19.  Advised patient that cough takes a while to resolve, benzonatate 100 mg tablet by mouth twice daily. Patient knows to follow-up with worsening unresolved symptoms.

## 2021-07-05 NOTE — Progress Notes (Signed)
   Virtual Visit  Note Due to COVID-19 pandemic this visit was conducted virtually. This visit type was conducted due to national recommendations for restrictions regarding the COVID-19 Pandemic (e.g. social distancing, sheltering in place) in an effort to limit this patient's exposure and mitigate transmission in our community. All issues noted in this document were discussed and addressed.  A physical exam was not performed with this format.  I connected with Jean Campbell on 07/05/21 at 12:10 pm  by telephone and verified that I am speaking with the correct person using two identifiers. Jean Campbell is currently located at home during visit. The provider, Daryll Drown, NP is located in their office at time of visit.  I discussed the limitations, risks, security and privacy concerns of performing an evaluation and management service by telephone and the availability of in person appointments. I also discussed with the patient that there may be a patient responsible charge related to this service. The patient expressed understanding and agreed to proceed.   History and Present Illness:  Cough This is a recurrent problem. The current episode started 1 to 4 weeks ago. The problem has been unchanged. The problem occurs constantly. The cough is Productive of sputum. Pertinent negatives include no chills, ear congestion, ear pain, nasal congestion or rash. Nothing (Recent positive COVID-19) aggravates the symptoms. She has tried nothing for the symptoms.     Review of Systems  Constitutional:  Negative for chills and malaise/fatigue.  HENT:  Negative for ear pain.   Respiratory:  Positive for cough.   Cardiovascular: Negative.   Skin:  Negative for rash.  All other systems reviewed and are negative.12   Observations/Objective:  Televisit patient not in distress.   Assessment and Plan:  Benzonate 100 mg tablet by mouth twice a day.  For cough.  Educated patient that cough takes a  while to resolve, patient recently tested and treated for COVID-19. Rx sent to pharmacy.  Follow Up Instructions: Follow-up with worsening unresolved symptoms.    I discussed the assessment and treatment plan with the patient. The patient was provided an opportunity to ask questions and all were answered. The patient agreed with the plan and demonstrated an understanding of the instructions.   The patient was advised to call back or seek an in-person evaluation if the symptoms worsen or if the condition fails to improve as anticipated.  The above assessment and management plan was discussed with the patient. The patient verbalized understanding of and has agreed to the management plan. Patient is aware to call the clinic if symptoms persist or worsen. Patient is aware when to return to the clinic for a follow-up visit. Patient educated on when it is appropriate to go to the emergency department.   Time call ended: 12:20 PM  I provided 10 minutes of  non face-to-face time during this encounter.    Daryll Drown, NP

## 2021-07-25 ENCOUNTER — Emergency Department (HOSPITAL_COMMUNITY): Admission: EM | Admit: 2021-07-25 | Payer: Self-pay | Source: Home / Self Care

## 2021-07-25 ENCOUNTER — Emergency Department (HOSPITAL_COMMUNITY)
Admission: EM | Admit: 2021-07-25 | Discharge: 2021-07-25 | Disposition: A | Discharge status: Home / Self Care | Payer: Commercial Managed Care - PPO | Attending: Emergency Medicine | Admitting: Emergency Medicine

## 2021-07-25 ENCOUNTER — Other Ambulatory Visit: Payer: Self-pay

## 2021-07-25 DIAGNOSIS — Z91040 Latex allergy status: Secondary | ICD-10-CM | POA: Insufficient documentation

## 2021-07-25 DIAGNOSIS — E876 Hypokalemia: Secondary | ICD-10-CM | POA: Diagnosis not present

## 2021-07-25 DIAGNOSIS — F17210 Nicotine dependence, cigarettes, uncomplicated: Secondary | ICD-10-CM | POA: Diagnosis not present

## 2021-07-25 DIAGNOSIS — F10120 Alcohol abuse with intoxication, uncomplicated: Secondary | ICD-10-CM | POA: Insufficient documentation

## 2021-07-25 DIAGNOSIS — Y908 Blood alcohol level of 240 mg/100 ml or more: Secondary | ICD-10-CM | POA: Insufficient documentation

## 2021-07-25 DIAGNOSIS — F10920 Alcohol use, unspecified with intoxication, uncomplicated: Secondary | ICD-10-CM

## 2021-07-25 LAB — COMPREHENSIVE METABOLIC PANEL
ALT: 22 U/L (ref 0–44)
AST: 20 U/L (ref 15–41)
Albumin: 3.6 g/dL (ref 3.5–5.0)
Alkaline Phosphatase: 45 U/L (ref 38–126)
Anion gap: 7 (ref 5–15)
BUN: 14 mg/dL (ref 6–20)
CO2: 25 mmol/L (ref 22–32)
Calcium: 7.8 mg/dL — ABNORMAL LOW (ref 8.9–10.3)
Chloride: 106 mmol/L (ref 98–111)
Creatinine, Ser: 1 mg/dL (ref 0.44–1.00)
GFR, Estimated: >60 mL/min (ref >60)
Glucose, Bld: 111 mg/dL — ABNORMAL HIGH (ref 70–99)
Potassium: 3 mmol/L — ABNORMAL LOW (ref 3.5–5.1)
Sodium: 138 mmol/L (ref 135–145)
Total Bilirubin: 0.4 mg/dL (ref 0.3–1.2)
Total Protein: 6.1 g/dL — ABNORMAL LOW (ref 6.5–8.1)

## 2021-07-25 LAB — ETHANOL: Alcohol, Ethyl (B): 242 mg/dL — ABNORMAL HIGH (ref <10)

## 2021-07-25 LAB — CBC WITH DIFFERENTIAL/PLATELET
Band Neutrophils: 0 %
Basophils Relative: 0 %
Blasts: NONE SEEN %
Eosinophils Relative: 0 %
HCT: 34.3 % — ABNORMAL LOW (ref 36.0–46.0)
Hemoglobin: 11.7 g/dL — ABNORMAL LOW (ref 12.0–15.0)
Lymphocytes Relative: 56 %
MCH: 31.1 pg (ref 26.0–34.0)
MCHC: 34.1 g/dL (ref 30.0–36.0)
MCV: 91.2 fL (ref 80.0–100.0)
Metamyelocytes Relative: NONE SEEN %
Monocytes Relative: 5 %
Myelocytes: NONE SEEN %
Neutrophils Relative %: 39 %
Platelets: 222 10*3/uL (ref 150–400)
Promyelocytes Relative: NONE SEEN %
RBC Morphology: NORMAL
RBC: 3.76 MIL/uL — ABNORMAL LOW (ref 3.87–5.11)
RDW: 12 % (ref 11.5–15.5)
WBC Morphology: REACTIVE
WBC: 8 10*3/uL (ref 4.0–10.5)
nRBC: 0 % (ref 0.0–0.2)
nRBC: 0 /100 WBC

## 2021-07-25 LAB — LIPASE, BLOOD: Lipase: 26 U/L (ref 11–51)

## 2021-07-25 MED ORDER — POTASSIUM CHLORIDE CRYS ER 20 MEQ PO TBCR: 40.0000 meq | EXTENDED_RELEASE_TABLET | Freq: Every day | ORAL | 0 refills | Status: DC

## 2021-07-25 MED ORDER — SODIUM CHLORIDE 0.9 % IV BOLUS
500.0000 mL | Freq: Once | INTRAVENOUS | Status: AC
  Administered 2021-07-25: 500 mL via INTRAVENOUS

## 2021-07-25 MED ORDER — ONDANSETRON 4 MG PO TBDP: 4.0000 mg | ORAL_TABLET | Freq: Three times a day (TID) | ORAL | 0 refills | Status: DC | PRN

## 2021-07-25 MED ORDER — ONDANSETRON HCL 4 MG/2ML IJ SOLN
4.0000 mg | Freq: Once | INTRAMUSCULAR | Status: AC
  Administered 2021-07-25: 4 mg via INTRAVENOUS
  Filled 2021-07-25: qty 2
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
## 2021-07-25 NOTE — Discharge Instructions (Addendum)
You were seen in the ED today after heavy drinking and vomiting. Your potassium is low and I am giving extra potassium for the next several days. Please follow up with your PCP in the coming week for repeat blood work.
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
## 2021-08-22 ENCOUNTER — Emergency Department (HOSPITAL_COMMUNITY)
Admission: EM | Admit: 2021-08-22 | Discharge: 2021-08-22 | Disposition: A | Discharge status: Home / Self Care | Payer: Commercial Managed Care - PPO | Attending: Emergency Medicine | Admitting: Emergency Medicine

## 2021-08-22 ENCOUNTER — Encounter (HOSPITAL_COMMUNITY): Payer: Self-pay | Admitting: *Deleted

## 2021-08-22 ENCOUNTER — Other Ambulatory Visit: Payer: Self-pay

## 2021-08-22 ENCOUNTER — Emergency Department (HOSPITAL_COMMUNITY): Payer: Commercial Managed Care - PPO

## 2021-08-22 DIAGNOSIS — F17210 Nicotine dependence, cigarettes, uncomplicated: Secondary | ICD-10-CM | POA: Diagnosis not present

## 2021-08-22 DIAGNOSIS — L03211 Cellulitis of face: Secondary | ICD-10-CM

## 2021-08-22 DIAGNOSIS — R220 Localized swelling, mass and lump, head: Secondary | ICD-10-CM | POA: Diagnosis not present

## 2021-08-22 DIAGNOSIS — Z91040 Latex allergy status: Secondary | ICD-10-CM | POA: Diagnosis not present

## 2021-08-22 LAB — CBC WITH DIFFERENTIAL/PLATELET
Abs Immature Granulocytes: 0.04 10*3/uL (ref 0.00–0.07)
Basophils Absolute: 0 10*3/uL (ref 0.0–0.1)
Basophils Relative: 0 %
Eosinophils Absolute: 0.1 10*3/uL (ref 0.0–0.5)
Eosinophils Relative: 1 %
HCT: 38.9 % (ref 36.0–46.0)
Hemoglobin: 13.2 g/dL (ref 12.0–15.0)
Immature Granulocytes: 0 %
Lymphocytes Relative: 18 %
Lymphs Abs: 2.2 10*3/uL (ref 0.7–4.0)
MCH: 31.7 pg (ref 26.0–34.0)
MCHC: 33.9 g/dL (ref 30.0–36.0)
MCV: 93.5 fL (ref 80.0–100.0)
Monocytes Absolute: 0.6 10*3/uL (ref 0.1–1.0)
Monocytes Relative: 5 %
Neutro Abs: 9.5 10*3/uL — ABNORMAL HIGH (ref 1.7–7.7)
Neutrophils Relative %: 76 %
Platelets: 236 10*3/uL (ref 150–400)
RBC: 4.16 MIL/uL (ref 3.87–5.11)
RDW: 11.9 % (ref 11.5–15.5)
WBC: 12.3 10*3/uL — ABNORMAL HIGH (ref 4.0–10.5)
nRBC: 0 % (ref 0.0–0.2)

## 2021-08-22 LAB — BASIC METABOLIC PANEL
Anion gap: 6 (ref 5–15)
BUN: 12 mg/dL (ref 6–20)
CO2: 26 mmol/L (ref 22–32)
Calcium: 8 mg/dL — ABNORMAL LOW (ref 8.9–10.3)
Chloride: 104 mmol/L (ref 98–111)
Creatinine, Ser: 0.77 mg/dL (ref 0.44–1.00)
GFR, Estimated: >60 mL/min (ref >60)
Glucose, Bld: 98 mg/dL (ref 70–99)
Potassium: 3.7 mmol/L (ref 3.5–5.1)
Sodium: 136 mmol/L (ref 135–145)

## 2021-08-22 LAB — PREGNANCY, URINE: Preg Test, Ur: NEGATIVE

## 2021-08-22 MED ORDER — AZITHROMYCIN 250 MG PO TABS
500.0000 mg | ORAL_TABLET | Freq: Once | ORAL | Status: AC
  Administered 2021-08-22: 500 mg via ORAL
  Filled 2021-08-22: qty 2

## 2021-08-22 MED ORDER — IOHEXOL 350 MG/ML SOLN
60.0000 mL | Freq: Once | INTRAVENOUS | Status: AC | PRN
  Administered 2021-08-22: 60 mL via INTRAVENOUS

## 2021-08-22 MED ORDER — SODIUM CHLORIDE 0.9 % IV BOLUS
500.0000 mL | Freq: Once | INTRAVENOUS | Status: AC
  Administered 2021-08-22: 500 mL via INTRAVENOUS

## 2021-08-22 MED ORDER — FLUCONAZOLE 150 MG PO TABS: 150.0000 mg | ORAL_TABLET | Freq: Every day | ORAL | 0 refills | Status: DC

## 2021-08-22 MED ORDER — BACITRACIN-POLYMYXIN B 500-10000 UNIT/GM OP OINT: 1.0000 "application " | TOPICAL_OINTMENT | Freq: Two times a day (BID) | OPHTHALMIC | 0 refills | Status: DC

## 2021-08-22 MED ORDER — ONDANSETRON HCL 4 MG PO TABS: 4.0000 mg | ORAL_TABLET | Freq: Three times a day (TID) | ORAL | 0 refills | Status: DC | PRN

## 2021-08-22 MED ORDER — DOXYCYCLINE HYCLATE 100 MG PO TABS: 100.0000 mg | ORAL_TABLET | Freq: Once | ORAL | Status: DC

## 2021-08-22 MED ORDER — OXYCODONE HCL 5 MG PO TABS
5.0000 mg | ORAL_TABLET | Freq: Once | ORAL | Status: AC
  Administered 2021-08-22: 5 mg via ORAL
  Filled 2021-08-22: qty 1

## 2021-08-22 MED ORDER — AZITHROMYCIN 250 MG PO TABS: 250.0000 mg | ORAL_TABLET | Freq: Every day | ORAL | 0 refills | Status: AC

## 2021-08-22 MED ORDER — MORPHINE SULFATE (PF) 4 MG/ML IV SOLN
4.0000 mg | Freq: Once | INTRAVENOUS | Status: AC
  Administered 2021-08-22: 4 mg via INTRAVENOUS
  Filled 2021-08-22: qty 1

## 2021-08-22 MED ORDER — OXYCODONE HCL 5 MG PO TABS: 5.0000 mg | ORAL_TABLET | Freq: Four times a day (QID) | ORAL | 0 refills | Status: AC | PRN

## 2021-08-22 NOTE — Discharge Instructions (Addendum)
You have been seen and discharged from the emergency department.  You have been placed on antibiotics and eyedrops for facial infection.  Take nausea medicine as needed.  Take pain medicine as prescribed.  Do not mix this medication with alcohol or other sedating medications. Do not drive or do heavy physical activity and to know how this medication affects you.  It may cause drowsiness.  Follow-up with your primary provider for reevaluation and further care. Take home medications as prescribed. If you have any worsening symptoms, worsening redness/swelling, vision changes, fevers, severe headache or further concerns for your health please return to an emergency department for further evaluation.

## 2021-08-22 NOTE — ED Triage Notes (Signed)
Pt with swelling to right eye, stye x 5 days. Pt started to have swelling to roof of mouth and fever per pt.

## 2021-08-22 NOTE — ED Provider Notes (Signed)
[facility] EMERGENCY DEPARTMENT Provider Note   CSN: [id number] Arrival date & time: 08/22/21  1553     History Chief Complaint  Patient presents with   Facial Swelling    Jean Campbell is a 30 y.o. female.  HPI  30 year old female past medical history of anxiety, depression presents the emergency department with right eye swelling and pain.  Patient states that this started as a bump on her lower lid/stye.  She was placing over-the-counter stye medication however she began having swelling of the lower lid which is progressed to the upper lid with eye pain.  Denies any fever but endorses chills.  She is a Engineer, structural.  No vision change/vision loss.  The eye became red today and is painful with certain movements.  There is been thick discharge from the eye and the eye is crusted shut in the morning.  No trauma to the eye, no foreign body sensation.  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    Patient Active Problem List   Diagnosis Date Noted   Cough 07/05/2021   Acute pharyngitis 05/10/2021   IBS (irritable bowel syndrome) 03/29/2021   Alcohol abuse 03/29/2021   GERD (gastroesophageal reflux disease) 03/29/2021   Current smoker 02/28/2017   Depression 07/11/2016   GAD (generalized anxiety disorder) 07/11/2016    Past Surgical History:  Procedure Laterality Date   INSERTION OF MESH N/A 12/29/2016   Procedure: INSERTION OF MESH;  Surgeon: Axel Filler, MD;  Location: [facility] OR;  Service: General;  Laterality: N/A;   TONSILLECTOMY     TONSILLECTOMY  1996   UMBILICAL HERNIA REPAIR N/A 12/29/2016   Procedure:  LAPAROSCOPIC UMBILICAL HERNIA REPAIR WITH MESH;  Surgeon: Axel Filler, MD;  Location: [facility] OR;  Service: General;  Laterality: N/A;     OB History   No obstetric history on file.     Family History  Problem Relation Age of Onset   Hyperlipidemia Mother    Anxiety disorder Mother    Drug abuse Father    ADD / ADHD Sister    Anxiety disorder Sister    Drug abuse Sister    Arthritis Maternal Grandmother        rheumatoid   Diabetes Maternal Grandmother    Arthritis Maternal Grandfather        rheumatoid   Arthritis Paternal Grandmother    Hyperlipidemia Paternal Grandmother    Hypertension Paternal Grandmother    Arthritis Paternal Grandfather    Heart disease Paternal Grandfather    Hyperlipidemia Paternal Grandfather    Hypertension Paternal Grandfather    Stroke Paternal Grandfather     Social History   Tobacco Use   Smoking status: Every Day    Packs/day: 0.25    Types: Cigarettes    Start date: 04/02/2012    Last attempt to quit: 03/19/2013    Years since quitting: 8.4   Smokeless tobacco: Never  Vaping Use   Vaping Use: Never used  Substance Use Topics  Alcohol use: Yes    Alcohol/week: 14.0 standard drinks    Types: 14 Shots of liquor per week    Comment: moderate use   Drug use: Yes    Types: Marijuana    Comment: history of meth use-last used 3 years ago    Home Medications Prior to Admission medications   Medication Sig Start Date End Date Taking? Authorizing Provider  acetaminophen (TYLENOL) 500 MG tablet Take 1 tablet (500 mg total) by mouth every 6 (six) hours as needed. 05/10/21   Daryll Drown, NP  amoxicillin-clavulanate (AUGMENTIN) 875-125 MG tablet Take 1 tablet by mouth 2 (two) times daily. 05/10/21   Daryll Drown, NP  benzonatate (TESSALON) 100 MG capsule Take 1 capsule (100 mg total) by mouth 2 (two) times daily as needed for cough. 07/05/21   Daryll Drown, NP  dextromethorphan-guaiFENesin (MUCINEX DM) 30-600 MG 12hr tablet Take 1  tablet by mouth 2 (two) times daily. 05/10/21   Daryll Drown, NP  dicyclomine (BENTYL) 10 MG capsule Take 1 capsule (10 mg total) by mouth every 12 (twelve) hours as needed (abdominal pain). 03/29/21   Dolores Frame, MD  omeprazole (PRILOSEC) 40 MG capsule Take 1 capsule (40 mg total) by mouth daily. 03/29/21   Dolores Frame, MD  ondansetron (ZOFRAN ODT) 4 MG disintegrating tablet Take 1 tablet (4 mg total) by mouth every 8 (eight) hours as needed for nausea or vomiting. 07/25/21   Long, Arlyss Repress, MD  potassium chloride SA (KLOR-CON) 20 MEQ tablet Take 2 tablets (40 mEq total) by mouth daily for 5 days. 07/25/21 07/30/21  Long, Arlyss Repress, MD  valACYclovir (VALTREX) 1000 MG tablet Take 2 tablets twice a day for 1 day at fever blister onset. Patient not taking: Reported on 03/29/2021 01/18/21   Gabriel Earing, FNP    Allergies    Lactase, Latex, and Sulfa antibiotics  Review of Systems   Review of Systems  Constitutional:  Positive for chills and fatigue. Negative for fever.  HENT:  Negative for congestion.   Eyes:  Positive for photophobia, pain, discharge and redness. Negative for visual disturbance.  Respiratory:  Negative for shortness of breath.   Cardiovascular:  Negative for chest pain.  Gastrointestinal:  Negative for abdominal pain, diarrhea and vomiting.  Genitourinary:  Negative for dysuria.  Skin:  Negative for rash.  Neurological:  Negative for headaches.   Physical Exam Updated Vital Signs BP (!) 130/105 (BP Location: Right Arm)   Pulse 97   Temp 97.7 F (36.5 C) (Oral)   Resp 16   Ht 5\' 3"  (1.6 m)   Wt 74.8 kg   LMP 08/17/2021   SpO2 100%   BMI 29.23 kg/m   Physical Exam Vitals and nursing note reviewed.  Constitutional:      Appearance: Normal appearance.  HENT:     Head: Normocephalic.     Mouth/Throat:     Mouth: Mucous membranes are moist.  Eyes:     Extraocular Movements: Extraocular movements intact.     Pupils: Pupils are equal,  round, and reactive to light.     Comments: Right lower periorbital edema and erythema that tracks to the right upper lip, + scleral injection on the right, photophobia but equal reactive pupil, pain with certain EOMs, thick discharge in the temporal corner of the eye  Cardiovascular:     Rate and Rhythm: Normal rate.  Pulmonary:     Effort: Pulmonary effort is normal. No respiratory distress.  Skin:    General: Skin is warm.  Neurological:     Mental Status: She is alert and oriented to person, place, and time. Mental status is at baseline.  Psychiatric:        Mood and Affect: Mood normal.    ED Results / Procedures / Treatments   Labs (all labs ordered are listed, but only abnormal results are displayed) Labs Reviewed  CBC WITH DIFFERENTIAL/PLATELET  BASIC METABOLIC PANEL  PREGNANCY, URINE    EKG None  Radiology No results found.  Procedures Procedures   Medications Ordered in ED Medications  sodium chloride 0.9 % bolus 500 mL (has no administration in time range)    ED Course  I have reviewed the triage vital signs and the nursing notes.  Pertinent labs & imaging results that were available during my care of the patient were reviewed by me and considered in my medical decision making (see chart for details).    MDM Rules/Calculators/A&P                           30 year old female presents emergency department with right periorbital swelling and right eye pain.  Afebrile on arrival.  Blood work shows a mild leukocytosis but is reassuring.  Upper and lower lid swelling noted to the right eye with mild scleral injection.  Visual acuity intact.  Slight pain with extraocular movements.  No trauma or foreign body sensation in the right eye.  Small amount of crusted discharge.  CAT scan shows cellulitis without any orbital cellulitis.  We will treat the patient with oral and intraocular antibiotics.  Patient at this time appears safe and stable for discharge and will be  treated as an outpatient.  Discharge plan and strict return to ED precautions discussed, patient verbalizes understanding and agreement.   Final Clinical Impression(s) / ED Diagnoses Final diagnoses:  None    Rx / DC Orders ED Discharge Orders     None        Rozelle Logan, DO 08/23/21 [id number]

## 2021-08-22 NOTE — ED Notes (Signed)
Provider in room  

## 2021-08-26 ENCOUNTER — Ambulatory Visit (INDEPENDENT_AMBULATORY_CARE_PROVIDER_SITE_OTHER): Payer: Commercial Managed Care - PPO | Admitting: Family Medicine

## 2021-08-26 ENCOUNTER — Encounter: Payer: Self-pay | Admitting: Family Medicine

## 2021-08-26 ENCOUNTER — Other Ambulatory Visit: Payer: Self-pay

## 2021-08-26 VITALS — BP 115/73 | HR 102 | Temp 98.1°F | Ht 63.0 in | Wt 161.2 lb

## 2021-08-26 DIAGNOSIS — F411 Generalized anxiety disorder: Secondary | ICD-10-CM

## 2021-08-26 DIAGNOSIS — Z09 Encounter for follow-up examination after completed treatment for conditions other than malignant neoplasm: Secondary | ICD-10-CM | POA: Diagnosis not present

## 2021-08-26 DIAGNOSIS — F322 Major depressive disorder, single episode, severe without psychotic features: Secondary | ICD-10-CM | POA: Diagnosis not present

## 2021-08-26 DIAGNOSIS — L03211 Cellulitis of face: Secondary | ICD-10-CM

## 2021-08-26 MED ORDER — FLUCONAZOLE 150 MG PO TABS: 150.0000 mg | ORAL_TABLET | Freq: Every day | ORAL | 0 refills | Status: DC

## 2021-08-26 MED ORDER — BACITRACIN-POLYMYXIN B 500-10000 UNIT/GM OP OINT: 1.0000 "application " | TOPICAL_OINTMENT | Freq: Two times a day (BID) | OPHTHALMIC | 0 refills | Status: DC

## 2021-08-26 MED ORDER — HYDROXYZINE PAMOATE 25 MG PO CAPS: 25.0000 mg | ORAL_CAPSULE | Freq: Three times a day (TID) | ORAL | 1 refills | Status: DC | PRN

## 2021-08-26 MED ORDER — AMOXICILLIN-POT CLAVULANATE 875-125 MG PO TABS: 1.0000 | ORAL_TABLET | Freq: Two times a day (BID) | ORAL | 0 refills | Status: DC

## 2021-08-26 NOTE — Progress Notes (Signed)
Established Patient Office Visit  Subjective:  Patient ID: Jean Campbell, female    DOB: 1990/12/19  Age: 30 y.o. MRN: [id number]  CC:  Chief Complaint  Patient presents with   Cellulitis    HPI Jean Campbell presents for hospital follow up. She was seen in the ED at [facility] on 08/22/21 for facial cellulitis around her right eye. She had a CAT that showed cellulitis but no orbital cellulitis. She was given polysporin ointment and a zpak for treatment. She was also given oxycodone for pain. She reports that her symptoms have definitely improved but she continues to have some pain. There is still redness of the lower lid and scleral injection of the lateral corner of her right eye. She still have some swelling of the right lower eyelid and below her right eye. She has some yellow drainage at times. Denies changes in vision. Denies fever or chills. She will complete the zpak today. She is almost out of the ointment for her eyes. She has been doing warm compresses. Her eye is also very itchy.   She is currently going through a divorce. Her husband and her also work at the same location and this is stressful. She is not really interested in taking daily medication for depression and anxiety at this time. She would like something that she can take prn for her anxiety.   Depression screen [facility] 2/9 08/26/2021 01/15/2021 12/18/2020  Decreased Interest 3 0 3  Down, Depressed, Hopeless 3 0 2  PHQ - 2 Score 6 0 5  Altered sleeping 2 - -  Tired, decreased energy 3 - -  Change in appetite 3 - -  Feeling bad or failure about yourself  3 - -  Trouble concentrating 2 - -  Moving slowly or fidgety/restless 0 - -  Suicidal thoughts 0 - -  PHQ-9 Score 19 - -  Difficult doing work/chores Very difficult - -   GAD 7 : Generalized Anxiety Score 08/26/2021  Nervous, Anxious, on Edge 2  Control/stop worrying 3  Worry too much - different things 3  Trouble relaxing 3  Restless 0  Easily annoyed or irritable 3   Afraid - awful might happen 3  Total GAD 7 Score 17  Anxiety Difficulty Very difficult      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    Past Surgical History:  Procedure Laterality Date   INSERTION OF MESH N/A 12/29/2016   Procedure: INSERTION OF MESH;  Surgeon: Axel Filler, MD;  Location: [facility] OR;  Service: General;  Laterality: N/A;   TONSILLECTOMY     TONSILLECTOMY  1996   UMBILICAL HERNIA REPAIR N/A 12/29/2016   Procedure: LAPAROSCOPIC UMBILICAL HERNIA REPAIR WITH MESH;  Surgeon: Axel Filler, MD;  Location: [facility] OR;  Service: General;  Laterality: N/A;    Family History  Problem Relation Age of Onset   Hyperlipidemia Mother    Anxiety disorder Mother    Drug abuse Father  ADD / ADHD Sister    Anxiety disorder Sister    Drug abuse Sister    Arthritis Maternal Grandmother        rheumatoid   Diabetes Maternal Grandmother    Arthritis Maternal Grandfather        rheumatoid   Arthritis Paternal Grandmother    Hyperlipidemia Paternal Grandmother    Hypertension Paternal Grandmother    Arthritis Paternal Grandfather    Heart disease Paternal Grandfather    Hyperlipidemia Paternal Grandfather    Hypertension Paternal Grandfather    Stroke Paternal Grandfather     Social History   Socioeconomic History   Marital status: Single    Spouse name: Not on file   Number of children: 0   Years of education: 15   Highest education level: Some college, no degree  Occupational History   Occupation: [employer] photographer  Tobacco Use   Smoking status: Every Day    Packs/day: 0.25    Types: Cigarettes    Start  date: 04/02/2012    Last attempt to quit: 03/19/2013    Years since quitting: 8.4   Smokeless tobacco: Never  Vaping Use   Vaping Use: Never used  Substance and Sexual Activity   Alcohol use: Yes    Alcohol/week: 14.0 standard drinks    Types: 14 Shots of liquor per week    Comment: moderate use   Drug use: Yes    Types: Marijuana    Comment: history of meth use-last used 3 years ago   Sexual activity: Yes    Birth control/protection: Condom  Other Topics Concern   Not on file  Social History Narrative   Not on file   Social Determinants of Health   Financial Resource Strain: Not on file  Food Insecurity: Not on file  Transportation Needs: Not on file  Physical Activity: Not on file  Stress: Not on file  Social Connections: Not on file  Intimate Partner Violence: Not on file    Outpatient Medications Prior to Visit  Medication Sig Dispense Refill   acetaminophen (TYLENOL) 500 MG tablet Take 1 tablet (500 mg total) by mouth every 6 (six) hours as needed. 30 tablet 0   azithromycin (ZITHROMAX) 250 MG tablet Take 1 tablet (250 mg total) by mouth daily for 4 days. Take 1 tablet by mouth daily 4 tablet 0   bacitracin-polymyxin b (POLYSPORIN) ophthalmic ointment Place 1 application into the right eye every 12 (twelve) hours. apply to eye every 12 hours while awake 3.5 g 0   ondansetron (ZOFRAN) 4 MG tablet Take 1 tablet (4 mg total) by mouth every 8 (eight) hours as needed for nausea or vomiting. 4 tablet 0   oxyCODONE (ROXICODONE) 5 MG immediate release tablet Take 1 tablet (5 mg total) by mouth every 6 (six) hours as needed for up to 5 days for severe pain. 20 tablet 0   Polyvinyl Alcohol-Povidone (STYE OP) Apply 1 application to eye 3 (three) times daily as needed (stye).     valACYclovir (VALTREX) 1000 MG tablet Take 2 tablets twice a day for 1 day at fever blister onset. 12 tablet 3   fluconazole (DIFLUCAN) 150 MG tablet Take 1 tablet (150 mg total) by mouth daily. Take  additional dose as needed for symptoms. 2 tablet 0   omeprazole (PRILOSEC) 40 MG capsule Take 1 capsule (40 mg total) by mouth daily. (Patient not taking: Reported on 08/26/2021) 90 capsule 3   amoxicillin-clavulanate (AUGMENTIN) 875-125 MG tablet Take 1 tablet by  mouth 2 (two) times daily. (Patient not taking: No sig reported) 20 tablet 0   benzonatate (TESSALON) 100 MG capsule Take 1 capsule (100 mg total) by mouth 2 (two) times daily as needed for cough. (Patient not taking: No sig reported) 20 capsule 0   dextromethorphan-guaiFENesin (MUCINEX DM) 30-600 MG 12hr tablet Take 1 tablet by mouth 2 (two) times daily. (Patient not taking: Reported on 08/22/2021) 30 tablet 0   dicyclomine (BENTYL) 10 MG capsule Take 1 capsule (10 mg total) by mouth every 12 (twelve) hours as needed (abdominal pain). (Patient not taking: Reported on 08/22/2021) 60 capsule 5   potassium chloride SA (KLOR-CON) 20 MEQ tablet Take 2 tablets (40 mEq total) by mouth daily for 5 days. (Patient not taking: Reported on 08/22/2021) 10 tablet 0   No facility-administered medications prior to visit.    Allergies  Allergen Reactions   Lactase     Other reaction(s): GI Upset (intolerance)   Latex Rash   Sulfa Antibiotics Rash    Childhood allergy     ROS Review of Systems As per HPI.    Objective:    Physical Exam Vitals and nursing note reviewed.  Constitutional:      General: She is not in acute distress.    Appearance: She is not ill-appearing, toxic-appearing or diaphoretic.  Eyes:     General: Vision grossly intact. Gaze aligned appropriately.        Right eye: No discharge.     Extraocular Movements: Extraocular movements intact.     Conjunctiva/sclera:     Right eye: Right conjunctiva is injected. No exudate.    Comments: Swelling with erythema noted to right lower eyelid. Tenderness below right eye with minimal swelling. No exudate today on exam.   Pulmonary:     Effort: Pulmonary effort is normal. No  respiratory distress.  Skin:    General: Skin is warm and dry.  Neurological:     General: No focal deficit present.     Mental Status: She is alert and oriented to person, place, and time.  Psychiatric:        Mood and Affect: Mood normal.        Behavior: Behavior normal.        Thought Content: Thought content normal.        Judgment: Judgment normal.    BP 115/73   Pulse (!) 102   Temp 98.1 F (36.7 C) (Temporal)   Ht 5\' 3"  (1.6 m)   Wt 161 lb 4 oz (73.1 kg)   LMP 08/17/2021   BMI 28.56 kg/m  Wt Readings from Last 3 Encounters:  08/26/21 161 lb 4 oz (73.1 kg)  08/22/21 165 lb (74.8 kg)  07/25/21 172 lb (78 kg)     There are no preventive care reminders to display for this patient.  There are no preventive care reminders to display for this patient.  Lab Results  Component Value Date   TSH 1.430 01/15/2021   Lab Results  Component Value Date   WBC 12.3 (H) 08/22/2021   HGB 13.2 08/22/2021   HCT 38.9 08/22/2021   MCV 93.5 08/22/2021   PLT 236 08/22/2021   Lab Results  Component Value Date   NA 136 08/22/2021   K 3.7 08/22/2021   CO2 26 08/22/2021   GLUCOSE 98 08/22/2021   BUN 12 08/22/2021   CREATININE 0.77 08/22/2021   BILITOT 0.4 07/25/2021   ALKPHOS 45 07/25/2021   AST 20 07/25/2021   ALT 22  07/25/2021   PROT 6.1 (L) 07/25/2021   ALBUMIN 3.6 07/25/2021   CALCIUM 8.0 (L) 08/22/2021   ANIONGAP 6 08/22/2021   Lab Results  Component Value Date   CHOL 183 01/15/2021   Lab Results  Component Value Date   HDL 44 01/15/2021   Lab Results  Component Value Date   LDLCALC 92 01/15/2021   Lab Results  Component Value Date   TRIG 283 (H) 01/15/2021   Lab Results  Component Value Date   CHOLHDL 4.2 01/15/2021   No results found for: HGBA1C    Assessment & Plan:   Bernardette was seen today for cellulitis.  Diagnoses and all orders for this visit:  Facial cellulitis Improving. Had CAT in ED that showed cellultitis without orbital  cellulitis. Augmentin ordered to start after completion on zpak today. Continue polysporin ointment. Vistaril for itching. Warm compresses. Diflucan ordered as patient reports yeast infections with abx use. Strict return precautions given. Follow up in 1 week, sooner if needed.  -     amoxicillin-clavulanate (AUGMENTIN) 875-125 MG tablet; Take 1 tablet by mouth 2 (two) times daily for 7 days. -     bacitracin-polymyxin b (POLYSPORIN) ophthalmic ointment; Place 1 application into the right eye every 12 (twelve) hours. apply to eye every 12 hours while awake -     hydrOXYzine (VISTARIL) 25 MG capsule; Take 1 capsule (25 mg total) by mouth 3 (three) times daily as needed for anxiety or itching. -     fluconazole (DIFLUCAN) 150 MG tablet; Take 1 tablet (150 mg total) by mouth daily. Take additional dose as needed for symptoms.  Hospital discharge follow-up Reviewed ED record.   Depression, major, single episode, severe (HCC) PHQ score of 19 today, currently going through a divorce. Denies SI. Declined treatment today.   GAD (generalized anxiety disorder) GAD score of 17 today. Declined daily medication regimen. Can take vistaril prn for anxiety.  -     hydrOXYzine (VISTARIL) 25 MG capsule; Take 1 capsule (25 mg total) by mouth 3 (three) times daily as needed for anxiety or itching.   Follow-up: Return in about 1 week (around 09/02/2021) for cellulitis follow up.   The patient indicates understanding of these issues and agrees with the plan.  Gabriel Earing, FNP

## 2021-08-26 NOTE — Patient Instructions (Signed)
Preseptal Cellulitis, Adult Preseptal cellulitis is an infection of the eyelid and the tissues around the eye (periorbital area). The infection causes painful swelling and redness. This condition may also be called periorbital cellulitis. In most cases, the condition can be treated with antibiotic medicine at home. It is important to treat preseptal cellulitis right away so that it does not get worse. If it gets worse, it can spread to the eye socket and eye muscles (orbital cellulitis). Orbital cellulitis is a medical emergency. What are the causes? Preseptal cellulitis is most commonly caused by bacteria. In rare cases, it can be caused by a virus or fungus. The germs that cause preseptal cellulitis may come from: A sinus infection that spreads near the eyes. An injury near the eye, such as a scratch, puncture wound, animal bite, or insect bite. A skin rash, such as eczema or poison ivy, that becomes infected. An infected pimple on the eyelid (stye). Infection after eyelid surgery or injury. What increases the risk? You are more likely to develop this condition if: You have a weakened disease-fighting system (immune system). You have a medical condition that raises your risk for sinus infections, such as nasal polyps. What are the signs or symptoms? Symptoms of this condition include: Eyelids that are red and swollen and feel unusually hot. Fever. Difficulty opening the eye. Headache. Pain in the face. Symptoms of this condition usually develop suddenly. How is this diagnosed? This condition may be diagnosed based on your symptoms, your medical history, and an eye exam. You may also have tests, such as: Blood tests. Tests (cultures) to find out which specific bacteria are causing the infection. You may have a culture of any open wound or drainage. CT scan. MRI. This is less common. How is this treated? This condition is treated with antibiotic medicines. These may be given by mouth  (orally), through an IV, or as an injection. In rare cases, you may need surgery to drain an infected area. Follow these instructions at home: Medicines Take your antibiotic medicine as told by your health care provider. Do not stop taking the antibiotic even if you start to feel better Take over-the-counter and prescription medicines only as told by your health care provider. Eye Care Do not use eye drops without first getting approval from your health care provider. Do not touch or rub your eye. If you wear contact lenses, do not wear them until your health care provider approves. Keep the eye area clean and dry. Wash the eye area with a clean washcloth, warm water, and baby shampoo or mild soap. To help relieve discomfort, place a clean washcloth that is wet with warm water over your eye. Leave the washcloth on for a few minutes, then remove it. General instructions Wash your hands with soap and water often for at least 20 seconds. If soap and water are not available, use hand sanitizer. Do not use any products that contain nicotine or tobacco, such as cigarettes, e-cigarettes, and chewing tobacco. If you need help quitting, ask your health care provider. Drink enough fluid to keep your urine pale yellow. Do not drive or operate machinery until your health care provider says that it is safe. Ask your health care provider if it is safe for you to drive. Stay up to date on your vaccinations. Keep all follow-up visits. This includes any visits with an eye specialist (ophthalmologist) or dentist. This is important. Get help right away if: You have new symptoms. Your symptoms get worse or   do not get better with treatment. You have a fever. Your vision becomes blurry or gets worse in any way. Your eye looks like it is sticking out or bulging out (proptosis). You develop double vision. You have trouble moving your eyes or pain when moving your eyes You have a severe headache. You have neck  stiffness or severe neck pain. These symptoms may represent a serious problem that is an emergency. Do not wait to see if the symptoms will go away. Get medical help right away. Call your local emergency services (911 in the [place] Do not drive yourself to the hospital. Summary Preseptal cellulitis is an infection of the eyelid and the tissues around the eye. Symptoms of preseptal cellulitis usually develop suddenly and include red and swollen eyelids, fever, difficulty opening the eye, headache, and facial pain. This condition is treated with antibiotic medicines. Do not stop taking the antibiotic even if you start to feel better. Preseptal cellulitis can develop into orbital cellulitis, which is a medical emergency. If your condition does not improve or worsens, visit your heath care provider right away. This information is not intended to replace advice given to you by your health care provider. Make sure you discuss any questions you have with your health care provider. Document Revised: 03/25/2020 Document Reviewed: 03/25/2020 Elsevier Patient Education  2022 [employer].

## 2021-09-02 ENCOUNTER — Other Ambulatory Visit: Payer: Self-pay

## 2021-09-02 ENCOUNTER — Ambulatory Visit: Payer: Commercial Managed Care - PPO | Admitting: Family Medicine

## 2021-09-02 ENCOUNTER — Ambulatory Visit (INDEPENDENT_AMBULATORY_CARE_PROVIDER_SITE_OTHER): Payer: Commercial Managed Care - PPO | Admitting: Family Medicine

## 2021-09-02 ENCOUNTER — Encounter: Payer: Self-pay | Admitting: Family Medicine

## 2021-09-02 VITALS — BP 135/87 | HR 93 | Temp 97.4°F | Ht 63.0 in | Wt 159.5 lb

## 2021-09-02 DIAGNOSIS — F322 Major depressive disorder, single episode, severe without psychotic features: Secondary | ICD-10-CM | POA: Diagnosis not present

## 2021-09-02 DIAGNOSIS — L03211 Cellulitis of face: Secondary | ICD-10-CM | POA: Diagnosis not present

## 2021-09-02 DIAGNOSIS — F411 Generalized anxiety disorder: Secondary | ICD-10-CM | POA: Diagnosis not present

## 2021-09-02 MED ORDER — FLUCONAZOLE 150 MG PO TABS: ORAL_TABLET | ORAL | 0 refills | Status: DC

## 2021-09-02 MED ORDER — HYDROXYZINE PAMOATE 50 MG PO CAPS: 50.0000 mg | ORAL_CAPSULE | Freq: Three times a day (TID) | ORAL | 0 refills | Status: DC | PRN

## 2021-09-02 MED ORDER — DOXYCYCLINE HYCLATE 100 MG PO TABS: 100.0000 mg | ORAL_TABLET | Freq: Two times a day (BID) | ORAL | 0 refills | Status: AC

## 2021-09-02 MED ORDER — CITALOPRAM HYDROBROMIDE 20 MG PO TABS: 20.0000 mg | ORAL_TABLET | Freq: Every day | ORAL | 5 refills | Status: DC

## 2021-09-02 NOTE — Progress Notes (Signed)
Acute Office Visit  Subjective:    Patient ID: Jean Campbell, female    DOB: September 14, 1991, 30 y.o.   MRN: [id number]  Chief Complaint  Patient presents with   Cellulitis    HPI Patient is in today for follow up of cellulitis. She did lose the last 2 days of her augmentin prescription. She is still having drainage from her right eye. It is still a little red. The pain is significantly better. She is reports some continued blurry vision. She is continuing to use her antibiotic eye ointment.   She is have increased depression and anxiety symptoms. She is having some suicidal thoughts but denies intent or plan. She would reach out to family if so. She would like to see a Veterinary surgeon. She is willing to try medication. She has tried prozac in the past but did not do well with it.   Depression screen [facility] 2/9 09/02/2021 08/26/2021 01/15/2021  Decreased Interest 3 3 0  Down, Depressed, Hopeless 3 3 0  PHQ - 2 Score 6 6 0  Altered sleeping 3 2 -  Tired, decreased energy 3 3 -  Change in appetite 3 3 -  Feeling bad or failure about yourself  3 3 -  Trouble concentrating 2 2 -  Moving slowly or fidgety/restless 2 0 -  Suicidal thoughts 2 0 -  PHQ-9 Score 24 19 -  Difficult doing work/chores Extremely dIfficult Very difficult -   GAD 7 : Generalized Anxiety Score 09/02/2021 08/26/2021  Nervous, Anxious, on Edge 3 2  Control/stop worrying 3 3  Worry too much - different things 3 3  Trouble relaxing 3 3  Restless 2 0  Easily annoyed or irritable 3 3  Afraid - awful might happen 3 3  Total GAD 7 Score 20 17  Anxiety Difficulty Very difficult Very difficult      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   IBS (irritable bowel syndrome)    PONV (postoperative nausea and vomiting)     after tonsils   Substance abuse (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    Past Surgical History:  Procedure Laterality Date   INSERTION OF MESH N/A 12/29/2016   Procedure: INSERTION OF MESH;  Surgeon: Axel Filler, MD;  Location: [facility] OR;  Service: General;  Laterality: N/A;   TONSILLECTOMY     TONSILLECTOMY  1996   UMBILICAL HERNIA REPAIR N/A 12/29/2016   Procedure: LAPAROSCOPIC UMBILICAL HERNIA REPAIR WITH MESH;  Surgeon: Axel Filler, MD;  Location: [facility] OR;  Service: General;  Laterality: N/A;    Family History  Problem Relation Age of Onset   Hyperlipidemia Mother    Anxiety disorder Mother    Drug abuse Father    ADD / ADHD Sister    Anxiety disorder Sister    Drug abuse Sister    Arthritis Maternal Grandmother        rheumatoid   Diabetes Maternal Grandmother    Arthritis Maternal Grandfather        rheumatoid   Arthritis Paternal Grandmother    Hyperlipidemia Paternal Grandmother    Hypertension Paternal Grandmother    Arthritis Paternal Actor  Heart disease Paternal Grandfather    Hyperlipidemia Paternal Grandfather    Hypertension Paternal Grandfather    Stroke Paternal Grandfather     Social History   Socioeconomic History   Marital status: Single    Spouse name: Not on file   Number of children: 0   Years of education: 15   Highest education level: Some college, no degree  Occupational History   Occupation: [employer]  Tobacco Use   Smoking status: Every Day    Packs/day: 0.25    Types: Cigarettes    Start date: 04/02/2012    Last attempt to quit: 03/19/2013    Years since quitting: 8.4   Smokeless tobacco: Never  Vaping Use   Vaping Use: Never used  Substance and Sexual Activity   Alcohol use: Yes    Alcohol/week: 14.0 standard drinks    Types: 14 Shots of liquor per week    Comment: moderate use   Drug use: Yes    Types: Marijuana    Comment: history of meth use-last used 3 years ago   Sexual activity: Yes    Birth  control/protection: Condom  Other Topics Concern   Not on file  Social History Narrative   Not on file   Social Determinants of Health   Financial Resource Strain: Not on file  Food Insecurity: Not on file  Transportation Needs: Not on file  Physical Activity: Not on file  Stress: Not on file  Social Connections: Not on file  Intimate Partner Violence: Not on file    Outpatient Medications Prior to Visit  Medication Sig Dispense Refill   acetaminophen (TYLENOL) 500 MG tablet Take 1 tablet (500 mg total) by mouth every 6 (six) hours as needed. 30 tablet 0   bacitracin-polymyxin b (POLYSPORIN) ophthalmic ointment Place 1 application into the right eye every 12 (twelve) hours. apply to eye every 12 hours while awake 7 g 0   hydrOXYzine (VISTARIL) 25 MG capsule Take 1 capsule (25 mg total) by mouth 3 (three) times daily as needed for anxiety or itching. 90 capsule 1   omeprazole (PRILOSEC) 40 MG capsule Take 1 capsule (40 mg total) by mouth daily. 90 capsule 3   ondansetron (ZOFRAN) 4 MG tablet Take 1 tablet (4 mg total) by mouth every 8 (eight) hours as needed for nausea or vomiting. 4 tablet 0   Polyvinyl Alcohol-Povidone (STYE OP) Apply 1 application to eye 3 (three) times daily as needed (stye).     valACYclovir (VALTREX) 1000 MG tablet Take 2 tablets twice a day for 1 day at fever blister onset. 12 tablet 3   amoxicillin-clavulanate (AUGMENTIN) 875-125 MG tablet Take 1 tablet by mouth 2 (two) times daily for 7 days. 14 tablet 0   fluconazole (DIFLUCAN) 150 MG tablet Take 1 tablet (150 mg total) by mouth daily. Take additional dose as needed for symptoms. 2 tablet 0   No facility-administered medications prior to visit.    Allergies  Allergen Reactions   Lactase     Other reaction(s): GI Upset (intolerance)   Latex Rash   Sulfa Antibiotics Rash    Childhood allergy     Review of Systems As per HPI.    Objective:    Physical Exam Vitals and nursing note reviewed.   Constitutional:      General: She is not in acute distress.    Appearance: She is not ill-appearing, toxic-appearing or diaphoretic.  Eyes:     Extraocular Movements: Extraocular movements intact.  Right eye: Normal extraocular motion and no nystagmus.     Conjunctiva/sclera:     Right eye: Right conjunctiva is injected (lateral side).     Comments: Swelling and erythema to right lower lid.   Pulmonary:     Effort: Pulmonary effort is normal. No respiratory distress.  Neurological:     Mental Status: She is alert.    BP 135/87   Pulse 93   Temp (!) 97.4 F (36.3 C) (Temporal)   Ht 5\' 3"  (1.6 m)   Wt 159 lb 8 oz (72.3 kg)   LMP 08/17/2021   BMI 28.25 kg/m  Wt Readings from Last 3 Encounters:  09/02/21 159 lb 8 oz (72.3 kg)  08/26/21 161 lb 4 oz (73.1 kg)  07/25/21 172 lb (78 kg)    There are no preventive care reminders to display for this patient.  There are no preventive care reminders to display for this patient.   Lab Results  Component Value Date   TSH 1.430 01/15/2021   Lab Results  Component Value Date   WBC 12.3 (H) 08/22/2021   HGB 13.2 08/22/2021   HCT 38.9 08/22/2021   MCV 93.5 08/22/2021   PLT 236 08/22/2021   Lab Results  Component Value Date   NA 136 08/22/2021   K 3.7 08/22/2021   CO2 26 08/22/2021   GLUCOSE 98 08/22/2021   BUN 12 08/22/2021   CREATININE 0.77 08/22/2021   BILITOT 0.4 07/25/2021   ALKPHOS 45 07/25/2021   AST 20 07/25/2021   ALT 22 07/25/2021   PROT 6.1 (L) 07/25/2021   ALBUMIN 3.6 07/25/2021   CALCIUM 8.0 (L) 08/22/2021   ANIONGAP 6 08/22/2021   Lab Results  Component Value Date   CHOL 183 01/15/2021   Lab Results  Component Value Date   HDL 44 01/15/2021   Lab Results  Component Value Date   LDLCALC 92 01/15/2021   Lab Results  Component Value Date   TRIG 283 (H) 01/15/2021   Lab Results  Component Value Date   CHOLHDL 4.2 01/15/2021   No results found for: HGBA1C     Assessment & Plan:    Demetria was seen today for cellulitis.  Diagnoses and all orders for this visit:  Facial cellulitis Improved, still some erythema and swelling present. Doxycyline ordered. Discussed if not resolved following this round, will need to see opthalmology.  -     doxycycline (VIBRA-TABS) 100 MG tablet; Take 1 tablet (100 mg total) by mouth 2 (two) times daily for 10 days. 1 po bid -     fluconazole (DIFLUCAN) 150 MG tablet; Take one tablet by mouth now and can repeat in 3 days  Depression, major, single episode, severe (HCC) Uncontrolled. Reports SI but no intent or plan. Verbal safety plan discussed. Hotline number given. Contact information given for counseling services. She declined referral and will seek out counseling herself. Start celexa as below.  -     citalopram (CELEXA) 20 MG tablet; Take 1 tablet (20 mg total) by mouth daily.  GAD (generalized anxiety disorder) Uncontrolled. Celexa as below. Can continue hydroxyzine.  -     citalopram (CELEXA) 20 MG tablet; Take 1 tablet (20 mg total) by mouth daily. -     hydrOXYzine (VISTARIL) 50 MG capsule; Take 1 capsule (50 mg total) by mouth 3 (three) times daily as needed for anxiety.  Return in about 8 days (around 09/10/2021) for eye follow up.  The patient indicates understanding of these issues and  agrees with the plan.  Gwenlyn Perking, FNP

## 2021-09-02 NOTE — Patient Instructions (Signed)

## 2021-09-09 ENCOUNTER — Ambulatory Visit: Payer: Commercial Managed Care - PPO | Admitting: Family Medicine

## 2021-09-14 ENCOUNTER — Encounter: Payer: Self-pay | Admitting: Family Medicine

## 2021-12-02 IMAGING — CT CT ORBITS W/ CM
3 series · 14 of 47 positions shown, 16 images · IV contrast (Omnipaque or Isovue)
Comparison: No pertinent prior exam.

CLINICAL DATA: Right orbital inflammation

EXAM:
CT ORBITS WITH CONTRAST
TECHNIQUE: Multidetector CT images was performed according to the standard
protocol following intravenous contrast administration.
CONTRAST:  60mL OMNIPAQUE IOHEXOL 350 MG/ML SOLN

[Series 2: max soft · axial · 0.39mm/px · z∈[+270,+360]mm · 8 of 53 slices shown, 10 images]
[im 4/53  brain]
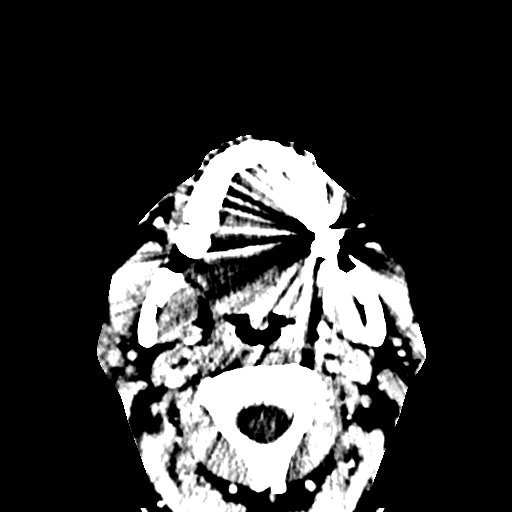
[im 4/53  bone]
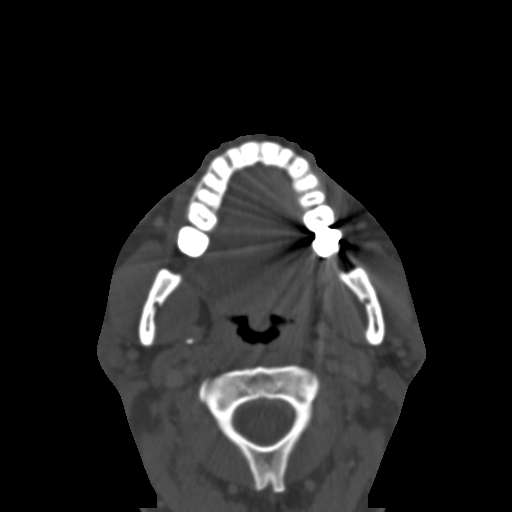
[im 11/53  bone]
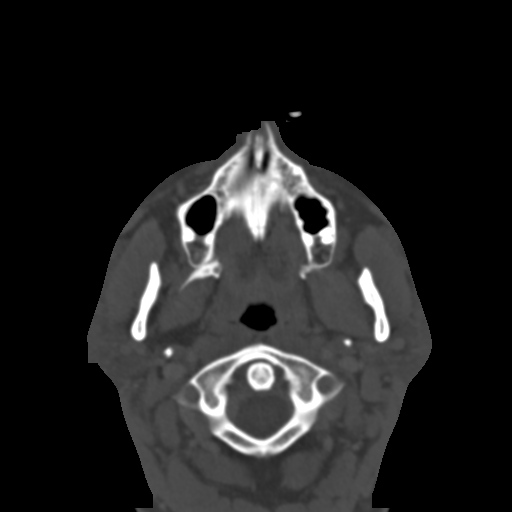
[im 17/53  bone]
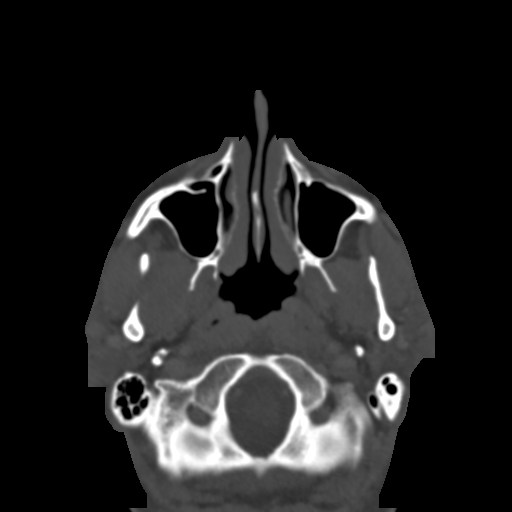
[im 24/53  bone]
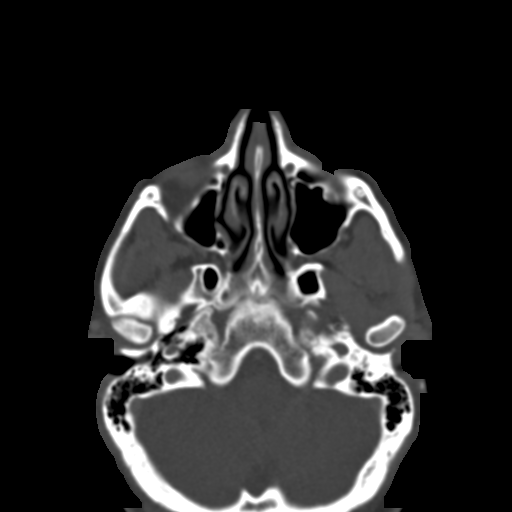
[im 29/53  brain]
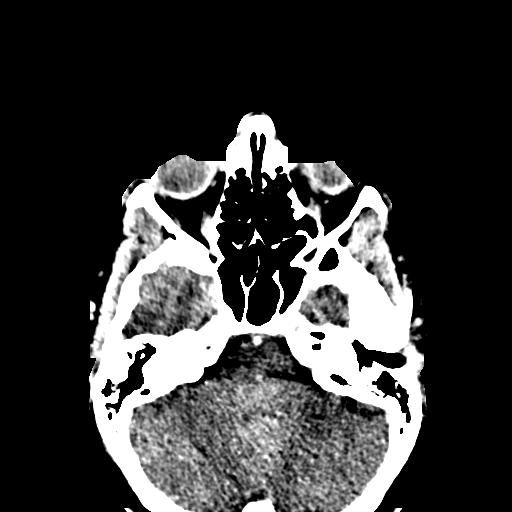
[im 29/53  bone]
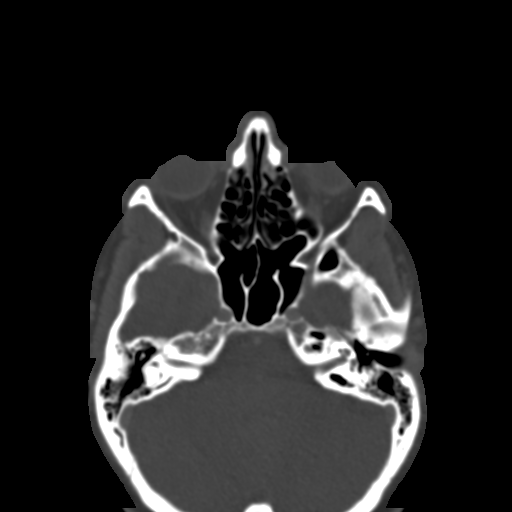
[im 36/53  bone]
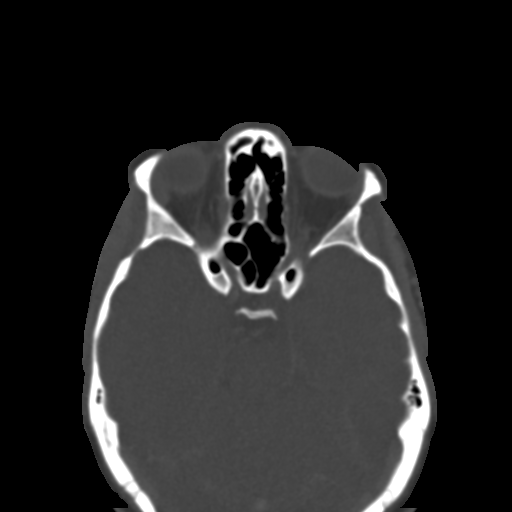
[im 42/53  bone]
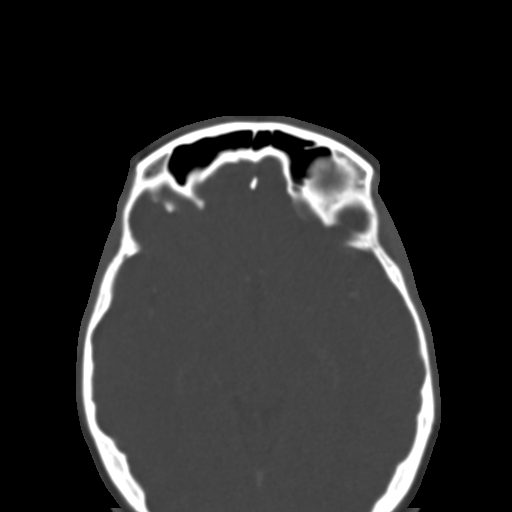
[im 49/53  bone]
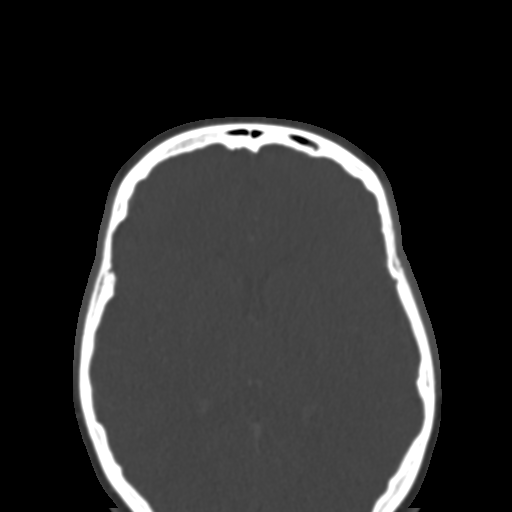

[Series 4: coronal soft · coronal · 0.20mm/px · 3 of 74 slices shown]
[im 25/74  bone]
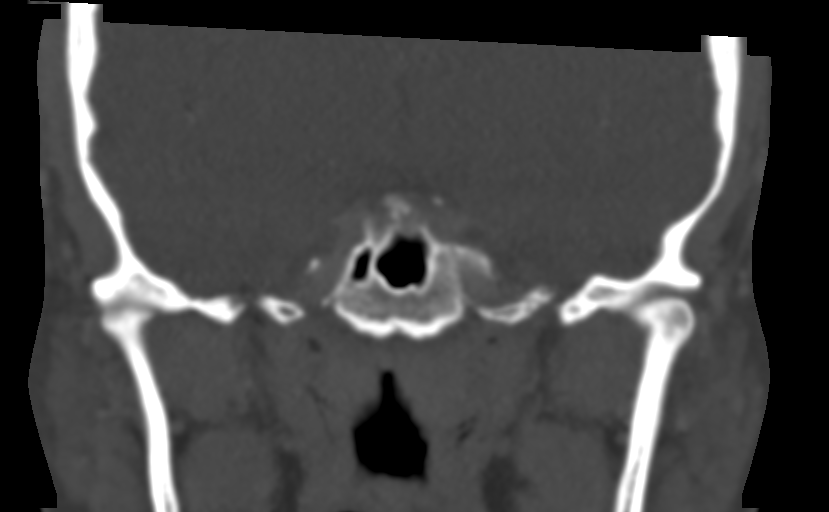
[im 33/74  bone]
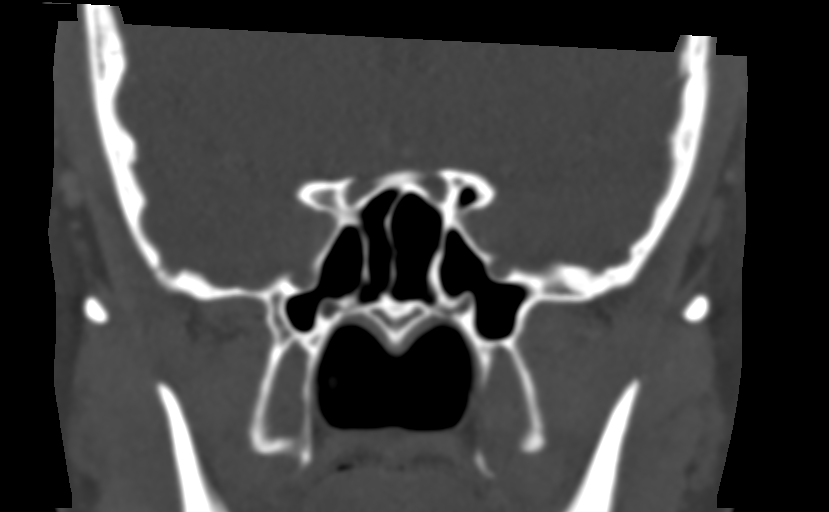
[im 41/74  bone]
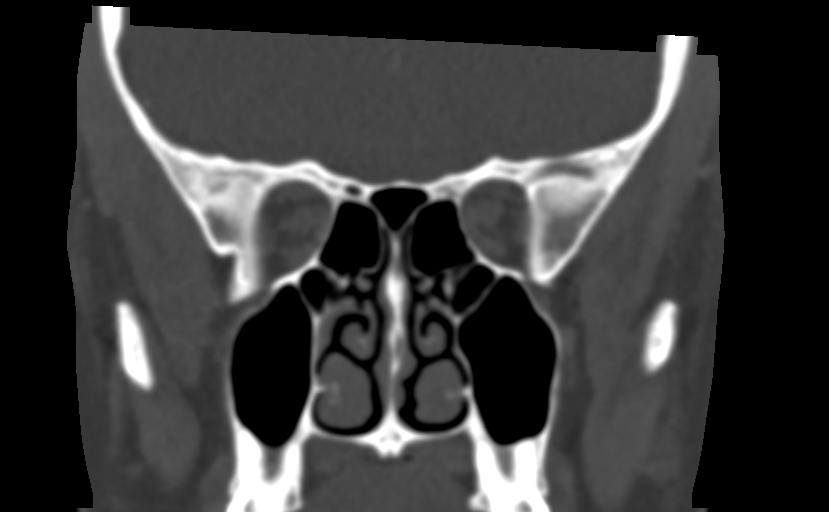

[Series 6: sagittal soft · sagittal · 0.17mm/px · 3 of 76 slices shown]
[im 26/76  bone]
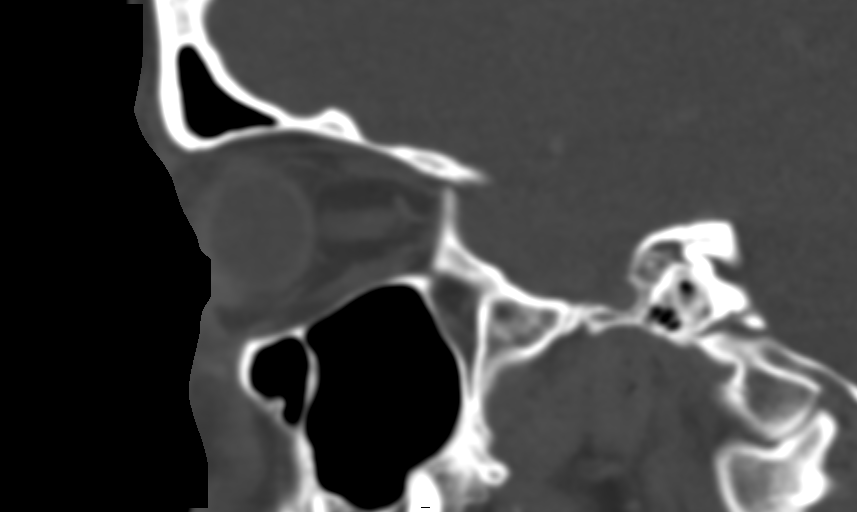
[im 38/76  bone]
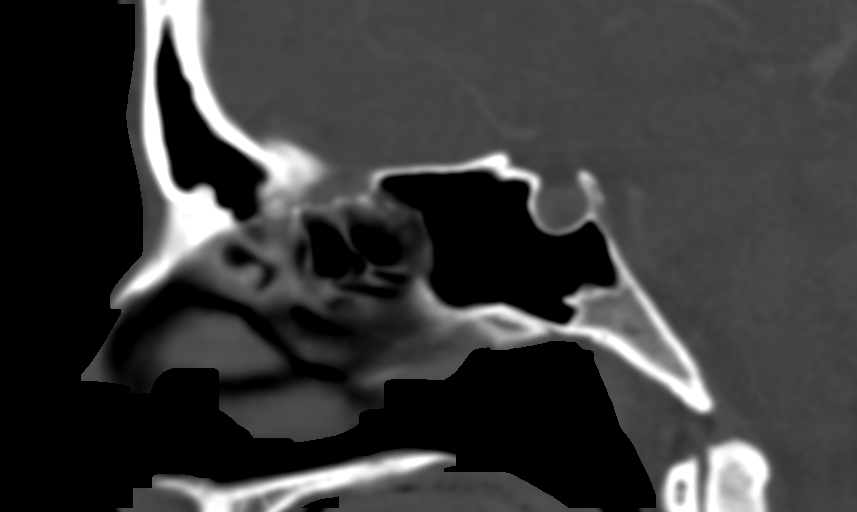
[im 51/76  bone]
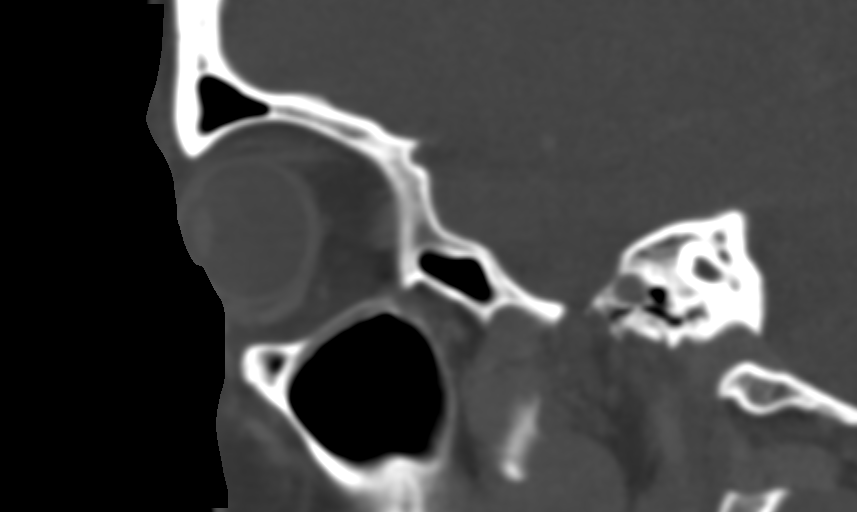

[14 of 47 positions shown; findings below may reference images not displayed]

FINDINGS: Orbits:

--Globes: Normal.

--Bony orbit: Normal.

--Preseptal soft tissues: Mild right-sided inflammation of the
anterior periorbital soft tissues.

--Intra- and extraconal orbital fat: Normal. No inflammatory
stranding.

--Optic nerves: Normal.

--Lacrimal glands and fossae: Normal.

--Extraocular muscles: Normal.

Visualized sinuses:  No fluid levels or advanced mucosal thickening.

Soft tissues: Normal.

Limited intracranial: Normal.
IMPRESSION: Mild right-sided anterior periorbital soft tissue inflammation. No
orbital cellulitis.

## 2022-04-20 ENCOUNTER — Ambulatory Visit: Payer: Commercial Managed Care - PPO | Admitting: Student

## 2022-04-22 ENCOUNTER — Emergency Department (HOSPITAL_COMMUNITY): Payer: Self-pay

## 2022-04-22 ENCOUNTER — Other Ambulatory Visit: Payer: Self-pay

## 2022-04-22 ENCOUNTER — Encounter (HOSPITAL_COMMUNITY): Payer: Self-pay

## 2022-04-22 ENCOUNTER — Emergency Department (HOSPITAL_COMMUNITY)
Admission: EM | Admit: 2022-04-22 | Discharge: 2022-04-23 | Disposition: A | Discharge status: Home / Self Care | Payer: Self-pay | Attending: Emergency Medicine | Admitting: Emergency Medicine

## 2022-04-22 DIAGNOSIS — Z91040 Latex allergy status: Secondary | ICD-10-CM | POA: Insufficient documentation

## 2022-04-22 DIAGNOSIS — K5901 Slow transit constipation: Secondary | ICD-10-CM | POA: Insufficient documentation

## 2022-04-22 DIAGNOSIS — R109 Unspecified abdominal pain: Secondary | ICD-10-CM

## 2022-04-22 LAB — URINALYSIS, ROUTINE W REFLEX MICROSCOPIC
Bilirubin Urine: NEGATIVE
Glucose, UA: NEGATIVE mg/dL
Hgb urine dipstick: NEGATIVE
Ketones, ur: NEGATIVE mg/dL
Leukocytes,Ua: NEGATIVE
Nitrite: NEGATIVE
Protein, ur: NEGATIVE mg/dL
Specific Gravity, Urine: 1.005 (ref 1.005–1.030)
pH: 7 (ref 5.0–8.0)

## 2022-04-22 LAB — CBC WITH DIFFERENTIAL/PLATELET
Abs Immature Granulocytes: 0.05 10*3/uL (ref 0.00–0.07)
Basophils Absolute: 0.1 10*3/uL (ref 0.0–0.1)
Basophils Relative: 1 %
Eosinophils Absolute: 0.2 10*3/uL (ref 0.0–0.5)
Eosinophils Relative: 2 %
HCT: 40.1 % (ref 36.0–46.0)
Hemoglobin: 13.6 g/dL (ref 12.0–15.0)
Immature Granulocytes: 1 %
Lymphocytes Relative: 34 %
Lymphs Abs: 3.7 10*3/uL (ref 0.7–4.0)
MCH: 30.6 pg (ref 26.0–34.0)
MCHC: 33.9 g/dL (ref 30.0–36.0)
MCV: 90.3 fL (ref 80.0–100.0)
Monocytes Absolute: 0.7 10*3/uL (ref 0.1–1.0)
Monocytes Relative: 6 %
Neutro Abs: 6.2 10*3/uL (ref 1.7–7.7)
Neutrophils Relative %: 56 %
Platelets: 247 10*3/uL (ref 150–400)
RBC: 4.44 MIL/uL (ref 3.87–5.11)
RDW: 12.9 % (ref 11.5–15.5)
WBC: 10.9 10*3/uL — ABNORMAL HIGH (ref 4.0–10.5)
nRBC: 0 % (ref 0.0–0.2)

## 2022-04-22 LAB — BASIC METABOLIC PANEL
Anion gap: 6 (ref 5–15)
BUN: 8 mg/dL (ref 6–20)
CO2: 25 mmol/L (ref 22–32)
Calcium: 8.4 mg/dL — ABNORMAL LOW (ref 8.9–10.3)
Chloride: 105 mmol/L (ref 98–111)
Creatinine, Ser: 1 mg/dL (ref 0.44–1.00)
GFR, Estimated: >60 mL/min (ref >60)
Glucose, Bld: 96 mg/dL (ref 70–99)
Potassium: 3.6 mmol/L (ref 3.5–5.1)
Sodium: 136 mmol/L (ref 135–145)

## 2022-04-22 LAB — HEPATIC FUNCTION PANEL
ALT: 217 U/L — ABNORMAL HIGH (ref 0–44)
AST: 73 U/L — ABNORMAL HIGH (ref 15–41)
Albumin: 3.9 g/dL (ref 3.5–5.0)
Alkaline Phosphatase: 84 U/L (ref 38–126)
Bilirubin, Direct: 0.1 mg/dL (ref 0.0–0.2)
Indirect Bilirubin: 0 mg/dL — ABNORMAL LOW (ref 0.3–0.9)
Total Bilirubin: 0.1 mg/dL — ABNORMAL LOW (ref 0.3–1.2)
Total Protein: 6.7 g/dL (ref 6.5–8.1)

## 2022-04-22 LAB — POC URINE PREG, ED: Preg Test, Ur: NEGATIVE

## 2022-04-22 LAB — LIPASE, BLOOD: Lipase: 48 U/L (ref 11–51)

## 2022-04-22 MED ORDER — FENTANYL CITRATE PF 50 MCG/ML IJ SOSY
50.0000 ug | PREFILLED_SYRINGE | Freq: Once | INTRAMUSCULAR | Status: AC
  Administered 2022-04-22: 50 ug via INTRAVENOUS
  Filled 2022-04-22: qty 1

## 2022-04-22 NOTE — ED Triage Notes (Signed)
Pt arrived via pOV c/o worsening left flank pain. Pt denies injury. Pt reports pain is localized and constant pressure.

## 2022-04-22 NOTE — ED Triage Notes (Signed)
Pt reports Hx of ovarian cysts.

## 2022-04-22 NOTE — ED Provider Notes (Signed)
[facility] EMERGENCY DEPARTMENT Provider Note   CSN: [id number] Arrival date & time: 04/22/22  2105     History {Add pertinent medical, surgical, social history, OB history to HPI:1} Chief Complaint  Patient presents with   Flank Pain    Jean Campbell is a 31 y.o. female.  The history is provided by the patient.  Flank Pain This is a new problem. The current episode started more than 2 days ago. The problem has been gradually worsening. Associated symptoms include abdominal pain. Pertinent negatives include no chest pain. Nothing relieves the symptoms.  Patient with history of substance use disorder presents with abdominal pain and flank pain.  She reports she has left upper quadrant pain that radiates to her left flank.  No fevers or vomiting.  No chest pain.  No dysuria or lower abdominal pain.  No vaginal ear discharge.  Patient is concerned because her significant other was recently admitted for a "blood infection"    Home Medications Prior to Admission medications   Medication Sig Start Date End Date Taking? Authorizing Provider  acetaminophen (TYLENOL) 500 MG tablet Take 1 tablet (500 mg total) by mouth every 6 (six) hours as needed. 05/10/21   Daryll Drown, NP  bacitracin-polymyxin b (POLYSPORIN) ophthalmic ointment Place 1 application into the right eye every 12 (twelve) hours. apply to eye every 12 hours while awake 08/26/21   Gabriel Earing, FNP  citalopram (CELEXA) 20 MG tablet Take 1 tablet (20 mg total) by mouth daily. 09/02/21   Gabriel Earing, FNP  fluconazole (DIFLUCAN) 150 MG tablet Take one tablet by mouth now and can repeat in 3 days 09/02/21   Gabriel Earing, FNP  hydrOXYzine (VISTARIL) 50 MG capsule Take 1 capsule (50 mg total) by mouth 3 (three) times daily as needed for anxiety. 09/02/21   Gabriel Earing, FNP  omeprazole (PRILOSEC) 40 MG capsule Take 1 capsule (40 mg total) by mouth daily. 03/29/21   Dolores Frame, MD  ondansetron  (ZOFRAN) 4 MG tablet Take 1 tablet (4 mg total) by mouth every 8 (eight) hours as needed for nausea or vomiting. 08/22/21   Horton, Clabe Seal, DO  Polyvinyl Alcohol-Povidone (STYE OP) Apply 1 application to eye 3 (three) times daily as needed (stye).    [provider]  valACYclovir (VALTREX) 1000 MG tablet Take 2 tablets twice a day for 1 day at fever blister onset. 01/18/21   Gabriel Earing, FNP      Allergies    Tilactase, Latex, and Sulfa antibiotics    Review of Systems   Review of Systems  Constitutional:  Negative for diaphoresis and fever.  Cardiovascular:  Negative for chest pain.  Gastrointestinal:  Positive for abdominal pain.  Genitourinary:  Positive for flank pain. Negative for vaginal bleeding and vaginal discharge.   Physical Exam Updated Vital Signs BP 121/77 (BP Location: Right Arm)   Pulse 97   Temp 98.1 F (36.7 C) (Oral)   Resp 18   Ht 1.6 m (5\' 3" )   Wt 73 kg   LMP 03/05/2022 (Approximate)   SpO2 99%   BMI 28.51 kg/m  Physical Exam CONSTITUTIONAL: Disheveled, sleeping HEAD: Normocephalic/atraumatic EYES: EOMI/PERRL ENMT: Mucous membranes moist NECK: supple no meningeal signs SPINE/BACK:entire spine nontender CV: S1/S2 noted, no murmurs/rubs/gallops noted LUNGS: Lungs are clear to auscultation bilaterally, no apparent distress ABDOMEN: soft, mild LUQ tenderness, no rebound or guarding, bowel sounds noted throughout abdomen GU:no cva tenderness NEURO: Pt is awake/alert/appropriate, moves all  extremitiesx4.  No facial droop.  Moves all extremities without difficulty EXTREMITIES: pulses normal/equal, full ROM SKIN: warm, color normal PSYCH: no abnormalities of mood noted, alert and oriented to situation  ED Results / Procedures / Treatments   Labs (all labs ordered are listed, but only abnormal results are displayed) Labs Reviewed  URINALYSIS, ROUTINE W REFLEX MICROSCOPIC - Abnormal; Notable for the following components:      Result Value    Color, Urine STRAW (*)    All other components within normal limits  CBC WITH DIFFERENTIAL/PLATELET - Abnormal; Notable for the following components:   WBC 10.9 (*)    All other components within normal limits  BASIC METABOLIC PANEL - Abnormal; Notable for the following components:   Calcium 8.4 (*)    All other components within normal limits  HEPATIC FUNCTION PANEL  LIPASE, BLOOD  POC URINE PREG, ED    EKG None  Radiology No results found.  Procedures Procedures  {Document cardiac monitor, telemetry assessment procedure when appropriate:1}  Medications Ordered in ED Medications  fentaNYL (SUBLIMAZE) injection 50 mcg (50 mcg Intravenous Given 04/22/22 2332)    ED Course/ Medical Decision Making/ A&P                           Medical Decision Making Amount and/or Complexity of Data Reviewed Labs: ordered. Radiology: ordered. ECG/medicine tests: ordered.  Risk Prescription drug management.   This patient presents to the ED for concern of abdominal pain and flank pain, this involves an extensive number of treatment options, and is a complaint that carries with it a high risk of complications and morbidity.  The differential diagnosis includes but is not limited to pancreatitis, gastritis, gastric ulcer, kidney stone, pyelonephritis  Comorbidities that complicate the patient evaluation: Patient's presentation is complicated by their history of reports previous history of ovarian cyst  Social Determinants of Health: Patient's  history of IV drug abuse and methamphetamine use   increases the complexity of managing their presentation  Additional history obtained: Additional history obtained from significant other Records reviewed Primary Care Documents  Lab Tests: I Ordered, and personally interpreted labs.  The pertinent results include: Mild leukocytosis  Imaging Studies ordered: I ordered imaging studies including CT scan renal   I independently visualized and  interpreted imaging which showed *** I agree with the radiologist interpretation  Cardiac Monitoring: The patient was maintained on a cardiac monitor.  I personally viewed and interpreted the cardiac monitor which showed an underlying rhythm of:  {cardiac monitor:26849}  Medicines ordered and prescription drug management: I ordered medication including ***  for ***  Reevaluation of the patient after these medicines showed that the patient    {resolved/improved/worsened:23923::"improved"}  Test Considered: Patient is low risk / negative by ***, therefore do not feel that *** is indicated.  Critical Interventions:  ***  Consultations Obtained: I requested consultation with the {consultation:26851}, and discussed  findings as well as pertinent plan - they recommend: ***  Reevaluation: After the interventions noted above, I reevaluated the patient and found that they have :{resolved/improved/worsened:23923::"improved"}  Complexity of problems addressed: Patient's presentation is most consistent with  {BHAL:93790}  Disposition: After consideration of the diagnostic results and the patient's response to treatment,  I feel that the patent would benefit from {disposition:26850}.     {Document critical care time when appropriate:1} {Document review of labs and clinical decision tools ie heart score, Chads2Vasc2 etc:1}  {Document your independent review of radiology  images, and any outside records:1} {Document your discussion with family members, caretakers, and with consultants:1} {Document social determinants of health affecting pt's care:1} {Document your decision making why or why not admission, treatments were needed:1} Final Clinical Impression(s) / ED Diagnoses Final diagnoses:  None    Rx / DC Orders ED Discharge Orders     None

## 2022-04-23 MED ORDER — DOCUSATE SODIUM 100 MG PO CAPS: 100.0000 mg | ORAL_CAPSULE | Freq: Two times a day (BID) | ORAL | 0 refills | Status: DC

## 2022-04-23 MED ORDER — KETOROLAC TROMETHAMINE 15 MG/ML IJ SOLN
15.0000 mg | Freq: Once | INTRAMUSCULAR | Status: AC
  Administered 2022-04-23: 15 mg via INTRAVENOUS
  Filled 2022-04-23: qty 1

## 2022-04-23 NOTE — ED Notes (Signed)
Patient transported to CT 

## 2022-04-23 NOTE — ED Notes (Signed)
Pt returned from CT Scan 

## 2022-06-16 ENCOUNTER — Ambulatory Visit (INDEPENDENT_AMBULATORY_CARE_PROVIDER_SITE_OTHER): Payer: Self-pay | Admitting: Family Medicine

## 2022-06-16 ENCOUNTER — Encounter: Payer: Self-pay | Admitting: Family Medicine

## 2022-06-16 DIAGNOSIS — B86 Scabies: Secondary | ICD-10-CM

## 2022-06-16 MED ORDER — PERMETHRIN 5 % EX CREA: TOPICAL_CREAM | CUTANEOUS | 0 refills | Status: DC

## 2022-06-16 NOTE — Progress Notes (Signed)
Virtual Visit via Telephone Note  I connected with Jean Campbell on 06/16/22 at 3:35 PM by telephone and verified that I am speaking with the correct person using two identifiers. Jean Campbell is currently located at a friend's house and nobody is currently with her during this visit. The provider, Gwenlyn Fudge, FNP is located in their office at time of visit.  I discussed the limitations, risks, security and privacy concerns of performing an evaluation and management service by telephone and the availability of in person appointments. I also discussed with the patient that there may be a patient responsible charge related to this service. The patient expressed understanding and agreed to proceed.  Subjective: PCP: Gabriel Earing, FNP  Chief Complaint  Patient presents with   Scabies   Patient reports a rash in the bends of her arms and legs, on her feet, and her neck.  She describes the rash as red spots in a line that are very itchy.  When she scratches them she creates sores.  Her boyfriend has a similar rash and was seen by his PCP today at which time he was diagnosed with scabies.   ROS: Per HPI  Current Outpatient Medications:    acetaminophen (TYLENOL) 500 MG tablet, Take 1 tablet (500 mg total) by mouth every 6 (six) hours as needed., Disp: 30 tablet, Rfl: 0   bacitracin-polymyxin b (POLYSPORIN) ophthalmic ointment, Place 1 application into the right eye every 12 (twelve) hours. apply to eye every 12 hours while awake, Disp: 7 g, Rfl: 0   citalopram (CELEXA) 20 MG tablet, Take 1 tablet (20 mg total) by mouth daily., Disp: 30 tablet, Rfl: 5   docusate sodium (COLACE) 100 MG capsule, Take 1 capsule (100 mg total) by mouth every 12 (twelve) hours., Disp: 60 capsule, Rfl: 0   fluconazole (DIFLUCAN) 150 MG tablet, Take one tablet by mouth now and can repeat in 3 days, Disp: 1 tablet, Rfl: 0   hydrOXYzine (VISTARIL) 50 MG capsule, Take 1 capsule (50 mg total) by mouth 3  (three) times daily as needed for anxiety., Disp: 90 capsule, Rfl: 0   omeprazole (PRILOSEC) 40 MG capsule, Take 1 capsule (40 mg total) by mouth daily., Disp: 90 capsule, Rfl: 3   ondansetron (ZOFRAN) 4 MG tablet, Take 1 tablet (4 mg total) by mouth every 8 (eight) hours as needed for nausea or vomiting., Disp: 4 tablet, Rfl: 0   Polyvinyl Alcohol-Povidone (STYE OP), Apply 1 application to eye 3 (three) times daily as needed (stye)., Disp: , Rfl:    valACYclovir (VALTREX) 1000 MG tablet, Take 2 tablets twice a day for 1 day at fever blister onset., Disp: 12 tablet, Rfl: 3  Allergies  Allergen Reactions   Tilactase     Other reaction(s): GI Upset (intolerance)   Latex Rash   Sulfa Antibiotics Rash    Childhood allergy    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 Scabies Education provided on scabies. - permethrin (ELIMITE) 5 % cream; Apply cream from head to feet, leave on for 8 to 14 hours, then wash with soap/water, repeat application if living mites present 14 days after initial treatment.  Dispense: 60 g; Refill: 0   Follow Up Instructions:  I discussed the assessment and treatment plan with the patient. The patient was provided an opportunity to ask questions and all were answered. The patient agreed with the plan and demonstrated an understanding of the instructions.   The patient was advised to call back or seek an in-person evaluation if the symptoms  worsen or if the condition fails to improve as anticipated.  The above assessment and management plan was discussed with the patient. The patient verbalized understanding of and has agreed to the management plan. Patient is aware to call the clinic if symptoms persist or worsen. Patient is aware when to return to the clinic for a follow-up visit. Patient educated on when it is appropriate to go to the emergency department.   Time call ended: 3:46 PM  I provided 11 minutes of non-face-to-face time during this encounter.  Deliah Boston, MSN, APRN, FNP-C [facility] 06/16/22

## 2022-08-02 IMAGING — CT CT RENAL STONE PROTOCOL
2 of 4 series · 16 of 46 positions shown, 18 images · non-contrast
Comparison: Report from abdominopelvic CT 04/29/2015, images
unavailable.

CLINICAL DATA: Flank pain, kidney stone suspected

Worsening left flank pain.
EXAM:
CT ABDOMEN AND PELVIS WITHOUT CONTRAST
TECHNIQUE: Multidetector CT imaging of the abdomen and pelvis was performed
following the standard protocol without IV contrast.
RADIATION DOSE REDUCTION: This exam was performed according to the
departmental dose-optimization program which includes automated
exposure control, adjustment of the mA and/or kV according to
patient size and/or use of iterative reconstruction technique.

[Series 2: axial st · axial · 0.82mm/px · z∈[+836,+1276]mm · 13 of 98 slices shown, 15 images]
[im 5/98  soft-tissue]
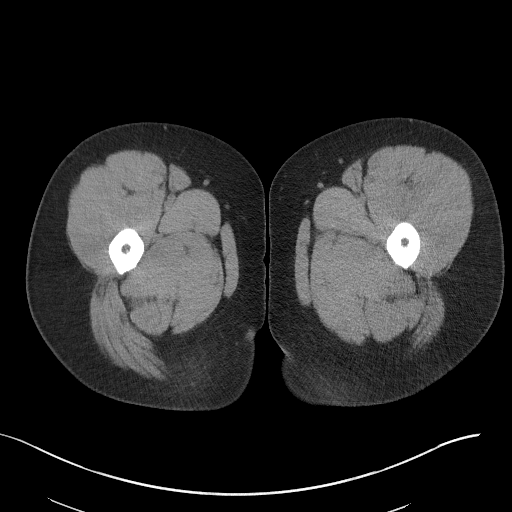
[im 5/98  bone]
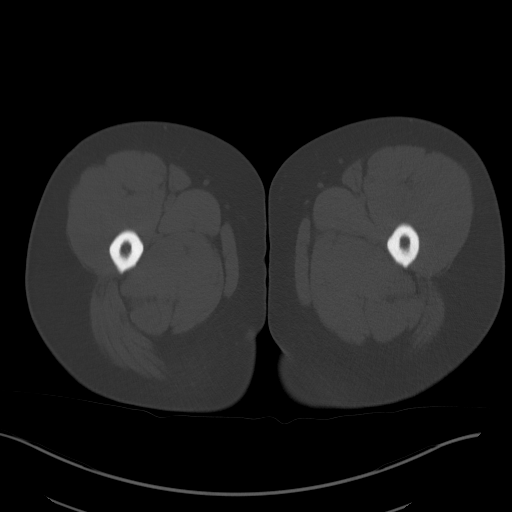
[im 15/98  soft-tissue]
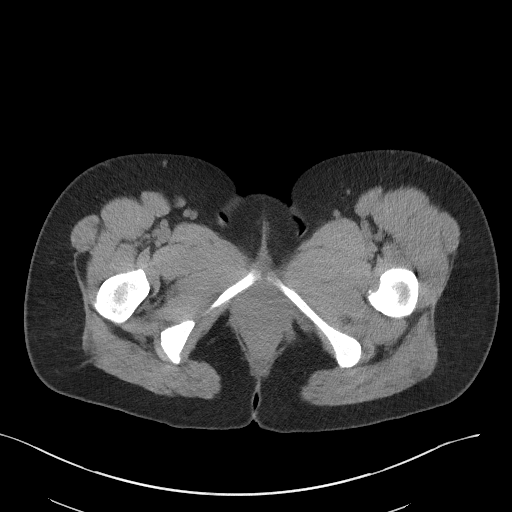
[im 20/98  soft-tissue]
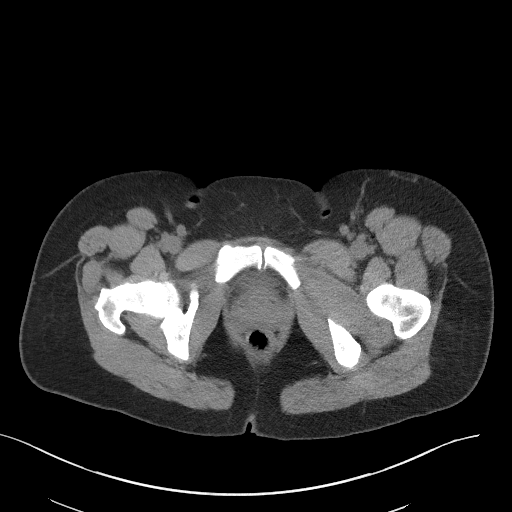
[im 30/98  soft-tissue]
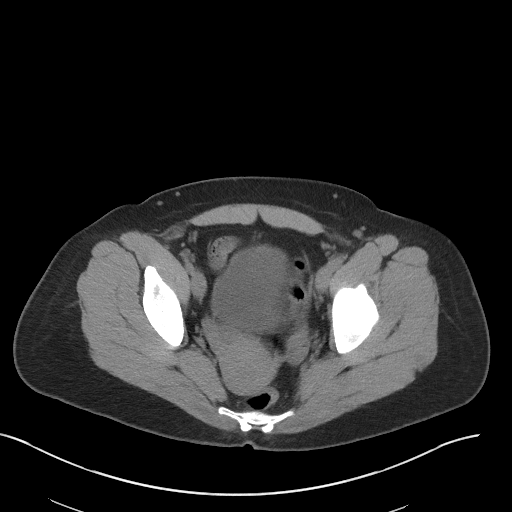
[im 34/98  soft-tissue]
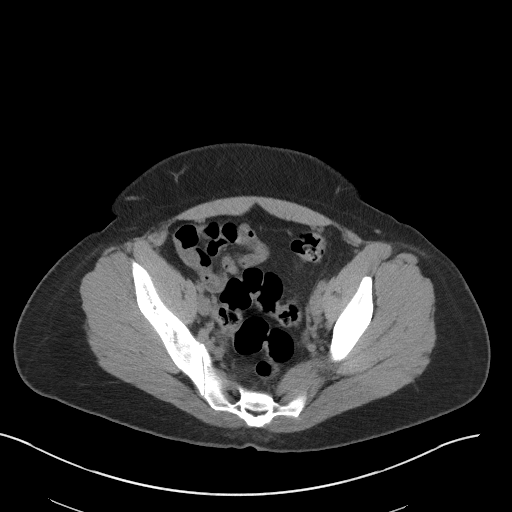
[im 44/98  soft-tissue]
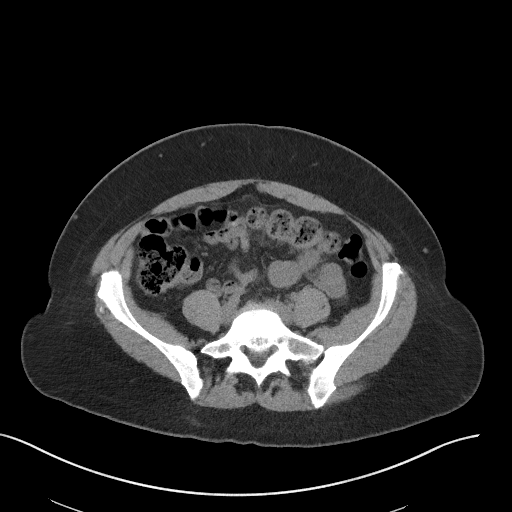
[im 49/98  soft-tissue]
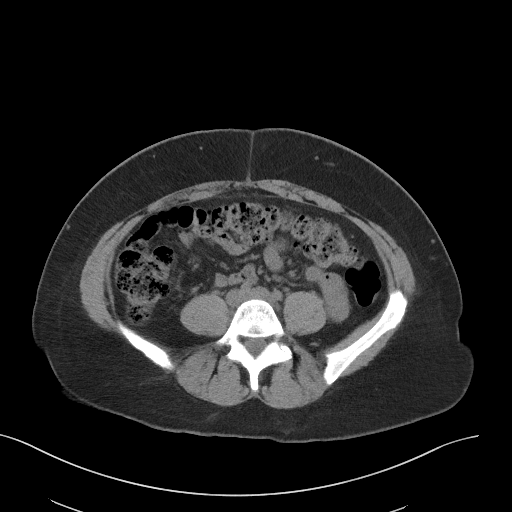
[im 54/98  soft-tissue]
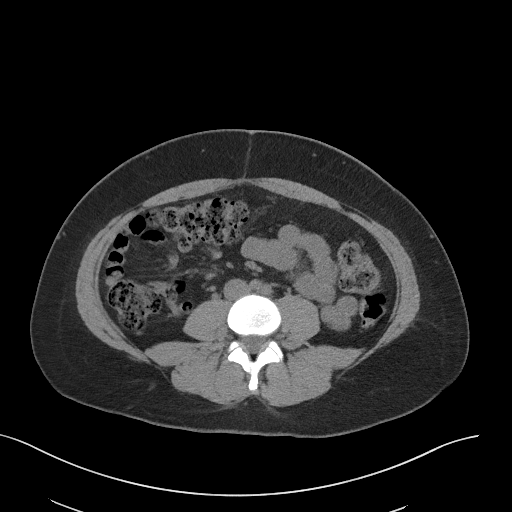
[im 64/98  soft-tissue]
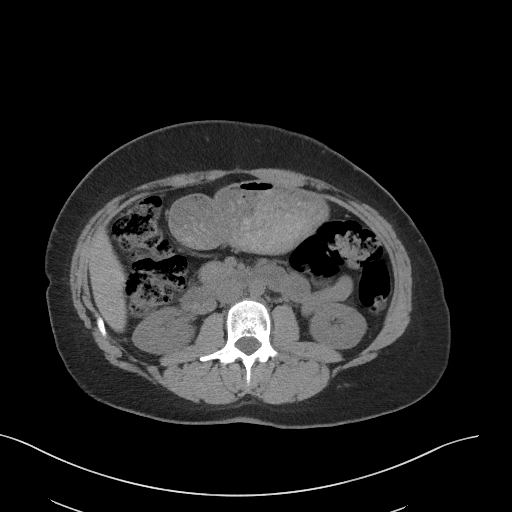
[im 64/98  bone]
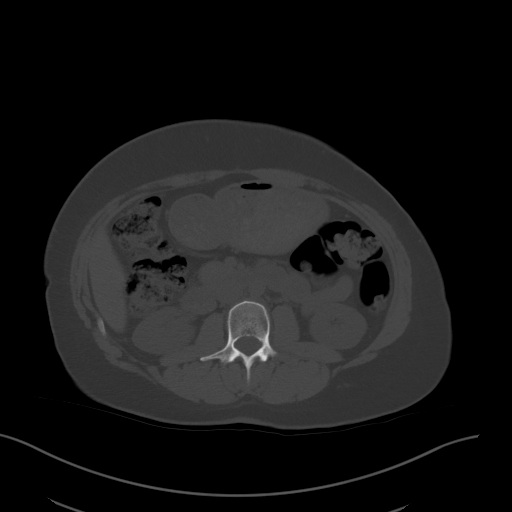
[im 68/98  soft-tissue]
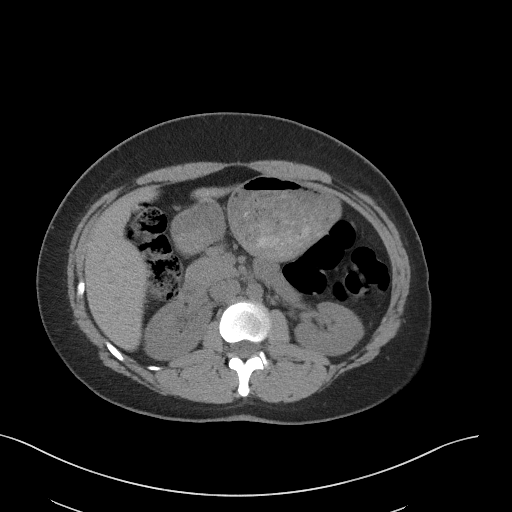
[im 78/98  soft-tissue]
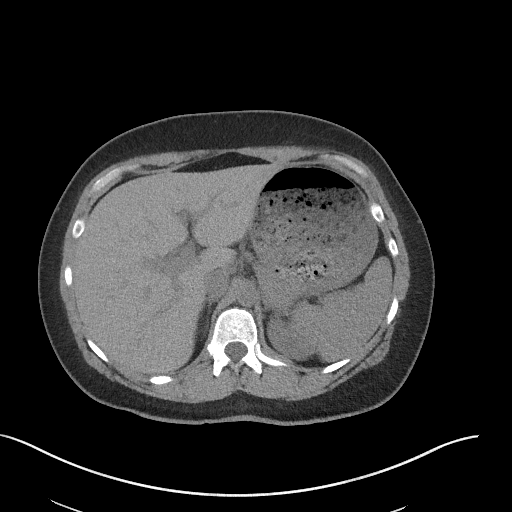
[im 83/98  soft-tissue]
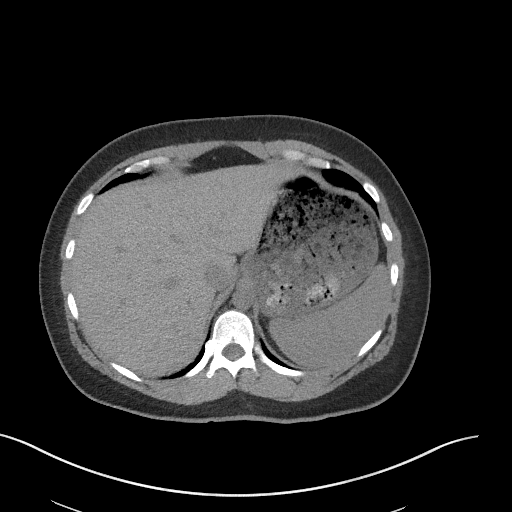
[im 93/98  soft-tissue]
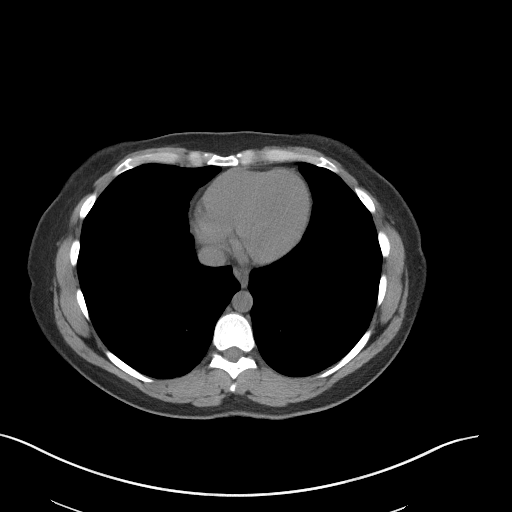

[Series 5: coronal st · coronal · 0.77mm/px · 3 of 105 slices shown]
[im 35/105  soft-tissue]
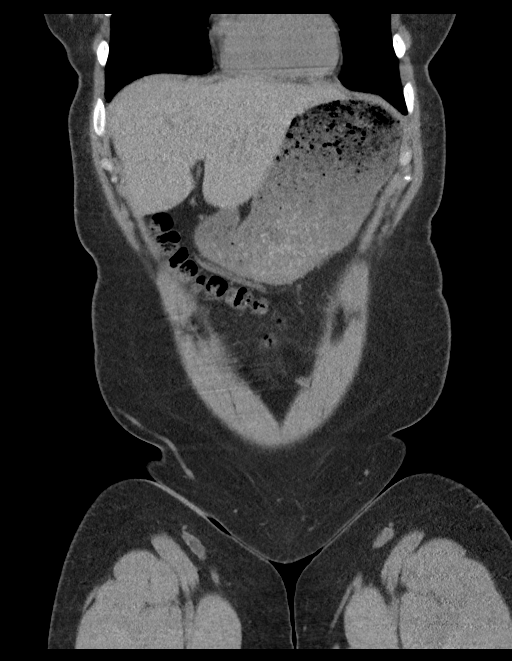
[im 47/105  soft-tissue]
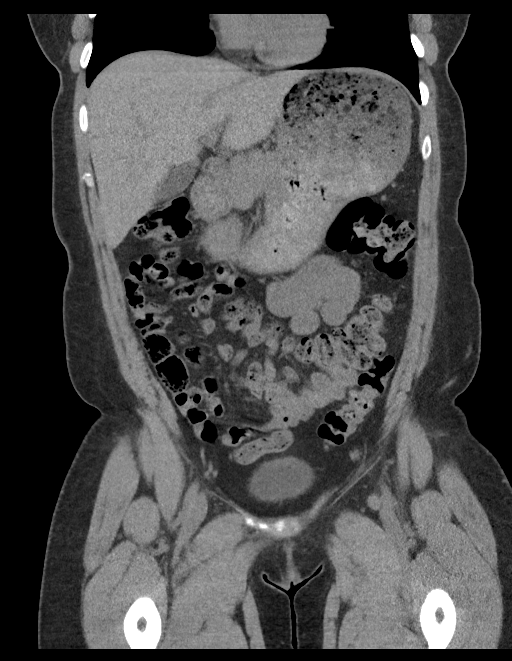
[im 58/105  soft-tissue]
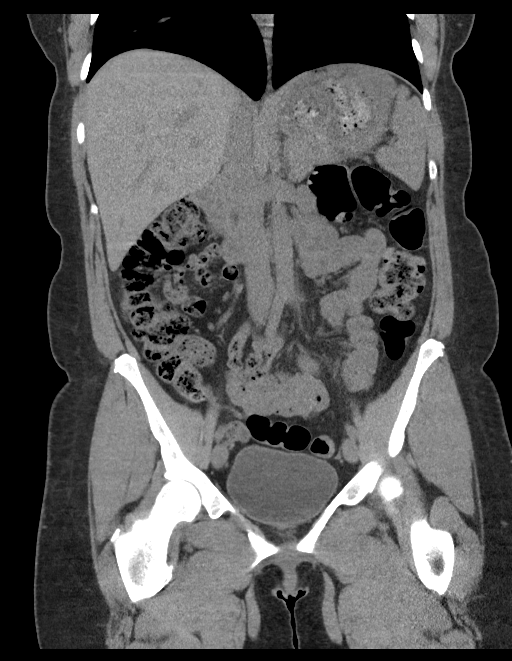

[16 of 46 positions shown; findings below may reference images not displayed]

FINDINGS: Lower chest: No acute airspace disease or pleural effusion.

Hepatobiliary: No focal liver abnormality on this unenhanced exam.
Gallbladder physiologically distended, no calcified stone. No
biliary dilatation.

Pancreas: No ductal dilatation or inflammation.

Spleen: Normal in size without focal abnormality.

Adrenals/Urinary Tract: Normal adrenal glands. No hydronephrosis or
renal calculi. No perinephric edema. No focal renal abnormality on
this unenhanced exam. Unremarkable urinary bladder. No wall
thickening or stone.

Stomach/Bowel: Ingested material distends the stomach. No small
bowel obstruction or inflammation. Normal appendix. Mild
fecalization of small bowel contents. Moderate volume of colonic
stool. No colonic inflammation.

Vascular/Lymphatic: Normal caliber abdominal aorta. No bulky
abdominopelvic adenopathy.

Reproductive: Retroverted uterus.  No adnexal mass.

Other: No free air, free fluid, or intra-abdominal fluid collection.
No abdominal wall hernia.

Musculoskeletal: There are no acute or suspicious osseous
abnormalities. Transitional lumbosacral anatomy with suspected
sacralization of L5
IMPRESSION: 1. No renal stones or obstructive uropathy.
2. Moderate colonic stool burden with fecalization of small bowel
contents, suggesting slow transit.

## 2022-09-07 ENCOUNTER — Telehealth: Payer: Self-pay | Admitting: Family Medicine

## 2022-09-07 NOTE — Telephone Encounter (Signed)
done

## 2022-11-15 ENCOUNTER — Encounter: Payer: Self-pay | Admitting: Family Medicine

## 2022-11-15 ENCOUNTER — Ambulatory Visit (INDEPENDENT_AMBULATORY_CARE_PROVIDER_SITE_OTHER): Payer: Self-pay | Admitting: Family Medicine

## 2022-11-15 VITALS — BP 122/75 | HR 104 | Temp 97.0°F | Ht 63.0 in | Wt 179.0 lb

## 2022-11-15 DIAGNOSIS — R062 Wheezing: Secondary | ICD-10-CM

## 2022-11-15 DIAGNOSIS — J069 Acute upper respiratory infection, unspecified: Secondary | ICD-10-CM

## 2022-11-15 MED ORDER — METHYLPREDNISOLONE ACETATE 40 MG/ML IJ SUSP
40.0000 mg | Freq: Once | INTRAMUSCULAR | Status: AC
  Administered 2022-11-15: 60 mg via INTRAMUSCULAR

## 2022-11-15 MED ORDER — ALBUTEROL SULFATE HFA 108 (90 BASE) MCG/ACT IN AERS: 2.0000 | INHALATION_SPRAY | Freq: Four times a day (QID) | RESPIRATORY_TRACT | 0 refills | Status: DC | PRN

## 2022-11-15 MED ORDER — AMOXICILLIN-POT CLAVULANATE 875-125 MG PO TABS: 1.0000 | ORAL_TABLET | Freq: Two times a day (BID) | ORAL | 0 refills | Status: DC

## 2022-11-15 NOTE — Progress Notes (Signed)
Subjective:  Patient ID: Jean Campbell, female    DOB: Jul 31, 1991, 31 y.o.   MRN: [id number]  Patient Care Team: Gwenlyn Perking, FNP as PCP - General (Family Medicine)   Chief Complaint:  Cough, Wheezing, and Shortness of Breath (X 1 week )   HPI: Jean Campbell is a 31 y.o. female presenting on 11/15/2022 for Cough, Wheezing, and Shortness of Breath (X 1 week )   Pt presents today with complaints of URI symptoms for over a week. States she feels as if she is unable to take a deep breath and this makes her feel short of breath.   Cough This is a new problem. Episode onset: 7-10 days ago. The problem has been gradually worsening. The problem occurs constantly. The cough is Productive of sputum. Associated symptoms include chills, ear congestion, ear pain, nasal congestion, postnasal drip and wheezing. Pertinent negatives include no chest pain, fever, headaches, heartburn, myalgias, rhinorrhea, sore throat, shortness of breath, sweats or weight loss. Nothing aggravates the symptoms. Risk factors for lung disease include smoking/tobacco exposure. She has tried OTC cough suppressant for the symptoms. The treatment provided no relief.  Wheezing  This is a new problem. Episode onset: 7-10 days ago. The problem occurs intermittently. The problem has been waxing and waning. Associated symptoms include chills, coughing and ear pain. Pertinent negatives include no abdominal pain, chest pain, diarrhea, fever, headaches, rhinorrhea, shortness of breath, sore throat or vomiting. The symptoms are aggravated by exercise. She has tried nothing for the symptoms.    Relevant past medical, surgical, family, and social history reviewed and updated as indicated.  Allergies and medications reviewed and updated. Data reviewed: Chart in Epic.   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    Past Surgical History:  Procedure Laterality Date   INSERTION OF MESH N/A 12/29/2016   Procedure: INSERTION OF MESH;  Surgeon: Ralene Ok, MD;  Location: [facility];  Service: General;  Laterality: N/A;   TONSILLECTOMY     TONSILLECTOMY  [id number]   UMBILICAL HERNIA REPAIR N/A 12/29/2016   Procedure: LAPAROSCOPIC UMBILICAL HERNIA REPAIR WITH MESH;  Surgeon: Ralene Ok, MD;  Location: [facility];  Service: General;  Laterality: N/A;    Social History   Socioeconomic History   Marital status: Single    Spouse name: Not on file   Number of children: 0   Years of education: 15   Highest education level: Some college, no degree  Occupational History   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  Tobacco Use   Smoking status: Every Day    Packs/day: 0.25    Types: Cigarettes    Start date: 04/02/2012    Last attempt to quit: 03/19/2013    Years since quitting: 9.6   Smokeless tobacco: Never  Vaping Use   Vaping Use: Never used  Substance and Sexual Activity   Alcohol use: Yes    Alcohol/week: 14.0 standard drinks of alcohol    Types: 14 Shots of  liquor per week    Comment: moderate use   Drug use: Yes    Types: Marijuana    Comment: history of meth use-last used 3 years ago   Sexual activity: Yes    Birth control/protection: Condom  Other Topics Concern   Not on file  Social History Narrative   Not on file   Social Determinants of Health   Financial Resource Strain: Not on file  Food Insecurity: Not on file  Transportation Needs: Not on file  Physical Activity: Not on file  Stress: Not on file  Social Connections: Not on file  Intimate Partner Violence: Not on file    Outpatient Encounter Medications as of 11/15/2022  Medication Sig    albuterol (VENTOLIN HFA) 108 (90 Base) MCG/ACT inhaler Inhale 2 puffs into the lungs every 6 (six) hours as needed for wheezing or shortness of breath.   amoxicillin-clavulanate (AUGMENTIN) 875-125 MG tablet Take 1 tablet by mouth 2 (two) times daily.   acetaminophen (TYLENOL) 500 MG tablet Take 1 tablet (500 mg total) by mouth every 6 (six) hours as needed. (Patient not taking: Reported on 11/15/2022)   [DISCONTINUED] bacitracin-polymyxin b (POLYSPORIN) ophthalmic ointment Place 1 application into the right eye every 12 (twelve) hours. apply to eye every 12 hours while awake   [DISCONTINUED] citalopram (CELEXA) 20 MG tablet Take 1 tablet (20 mg total) by mouth daily. (Patient not taking: Reported on 11/15/2022)   [DISCONTINUED] docusate sodium (COLACE) 100 MG capsule Take 1 capsule (100 mg total) by mouth every 12 (twelve) hours. (Patient not taking: Reported on 11/15/2022)   [DISCONTINUED] fluconazole (DIFLUCAN) 150 MG tablet Take one tablet by mouth now and can repeat in 3 days   [DISCONTINUED] hydrOXYzine (VISTARIL) 50 MG capsule Take 1 capsule (50 mg total) by mouth 3 (three) times daily as needed for anxiety. (Patient not taking: Reported on 11/15/2022)   [DISCONTINUED] omeprazole (PRILOSEC) 40 MG capsule Take 1 capsule (40 mg total) by mouth daily. (Patient not taking: Reported on 11/15/2022)   [DISCONTINUED] ondansetron (ZOFRAN) 4 MG tablet Take 1 tablet (4 mg total) by mouth every 8 (eight) hours as needed for nausea or vomiting.   [DISCONTINUED] permethrin (ELIMITE) 5 % cream Apply cream from head to feet, leave on for 8 to 14 hours, then wash with soap/water, repeat application if living mites present 14 days after initial treatment.   [DISCONTINUED] Polyvinyl Alcohol-Povidone (STYE OP) Apply 1 application to eye 3 (three) times daily as needed (stye). (Patient not taking: Reported on 11/15/2022)   [DISCONTINUED] valACYclovir (VALTREX) 1000 MG tablet Take 2 tablets twice a day for 1 day at  fever blister onset. (Patient not taking: Reported on 11/15/2022)   [EXPIRED] methylPREDNISolone acetate (DEPO-MEDROL) injection 40 mg    No facility-administered encounter medications on file as of 11/15/2022.    Allergies  Allergen Reactions   Tilactase     Other reaction(s): GI Upset (intolerance)   Latex Rash   Sulfa Antibiotics Rash    Childhood allergy     Review of Systems  Constitutional:  Positive for activity change and chills. Negative for appetite change, diaphoresis, fatigue, fever, unexpected weight change and weight loss.  HENT:  Positive for congestion, ear pain and postnasal drip. Negative for dental problem, drooling, ear discharge, facial swelling, hearing loss, mouth sores, nosebleeds, rhinorrhea, sinus pressure, sinus pain, sneezing, sore throat, tinnitus, trouble swallowing and voice change.   Eyes: Negative.  Negative for photophobia and visual disturbance.  Respiratory:  Positive for cough and wheezing.  Negative for apnea, choking, chest tightness, shortness of breath and stridor.   Cardiovascular:  Negative for chest pain, palpitations and leg swelling.  Gastrointestinal:  Negative for abdominal pain, blood in stool, constipation, diarrhea, heartburn, nausea and vomiting.  Endocrine: Negative.   Genitourinary:  Negative for decreased urine volume, difficulty urinating, dysuria, frequency and urgency.  Musculoskeletal:  Negative for arthralgias and myalgias.  Skin: Negative.   Allergic/Immunologic: Negative.   Neurological:  Negative for dizziness, weakness and headaches.  Hematological: Negative.   Psychiatric/Behavioral:  Negative for confusion, hallucinations, sleep disturbance and suicidal ideas.   All other systems reviewed and are negative.       Objective:  BP 122/75   Pulse (!) 104   Temp (!) 97 F (36.1 C) (Temporal)   Ht 5\' 3"  (1.6 m)   Wt 179 lb (81.2 kg)   SpO2 97%   BMI 31.71 kg/m    Wt Readings from Last 3 Encounters:  11/15/22 179  lb (81.2 kg)  04/22/22 160 lb 15 oz (73 kg)  09/02/21 159 lb 8 oz (72.3 kg)    Physical Exam Vitals and nursing note reviewed.  Constitutional:      General: She is not in acute distress.    Appearance: Normal appearance. She is well-developed and well-groomed. She is obese. She is not ill-appearing, toxic-appearing or diaphoretic.  HENT:     Head: Normocephalic and atraumatic.     Jaw: There is normal jaw occlusion.     Right Ear: Hearing normal. A middle ear effusion is present. Tympanic membrane is not erythematous or bulging.     Left Ear: Hearing normal. A middle ear effusion is present. Tympanic membrane is erythematous. Tympanic membrane is not bulging.     Nose: Nose normal.     Right Turbinates: Not enlarged, swollen or pale.     Left Turbinates: Not enlarged, swollen or pale.     Right Sinus: No maxillary sinus tenderness or frontal sinus tenderness.     Left Sinus: No maxillary sinus tenderness or frontal sinus tenderness.     Mouth/Throat:     Lips: Pink.     Mouth: Mucous membranes are moist.     Pharynx: Oropharynx is clear. Uvula midline. Posterior oropharyngeal erythema present. No pharyngeal swelling, oropharyngeal exudate or uvula swelling.  Eyes:     General: Lids are normal.     Extraocular Movements: Extraocular movements intact.     Conjunctiva/sclera: Conjunctivae normal.     Pupils: Pupils are equal, round, and reactive to light.  Neck:     Thyroid: No thyroid mass, thyromegaly or thyroid tenderness.     Vascular: No carotid bruit or JVD.     Trachea: Trachea and phonation normal.  Cardiovascular:     Rate and Rhythm: Normal rate and regular rhythm.     Chest Wall: PMI is not displaced.     Pulses: Normal pulses.     Heart sounds: Normal heart sounds. No murmur heard.    No friction rub. No gallop.  Pulmonary:     Effort: Pulmonary effort is normal. No respiratory distress.     Breath sounds: Examination of the right-lower field reveals wheezing.  Examination of the left-lower field reveals wheezing. Wheezing present. No decreased breath sounds, rhonchi or rales.  Abdominal:     General: Bowel sounds are normal. There is no distension or abdominal bruit.     Palpations: Abdomen is soft. There is no hepatomegaly or splenomegaly.     Tenderness: There is  no abdominal tenderness. There is no right CVA tenderness or left CVA tenderness.     Hernia: No hernia is present.  Musculoskeletal:        General: Normal range of motion.     Cervical back: Normal range of motion and neck supple.     Right lower leg: No edema.     Left lower leg: No edema.  Lymphadenopathy:     Cervical: No cervical adenopathy.  Skin:    General: Skin is warm and dry.     Capillary Refill: Capillary refill takes less than 2 seconds.     Coloration: Skin is not cyanotic, jaundiced or pale.     Findings: No rash.  Neurological:     General: No focal deficit present.     Mental Status: She is alert and oriented to person, place, and time.     Sensory: Sensation is intact.     Motor: Motor function is intact.     Coordination: Coordination is intact.     Gait: Gait is intact.     Deep Tendon Reflexes: Reflexes are normal and symmetric.  Psychiatric:        Attention and Perception: Attention and perception normal.        Mood and Affect: Mood and affect normal.        Speech: Speech normal.        Behavior: Behavior normal. Behavior is cooperative.        Thought Content: Thought content normal.        Cognition and Memory: Cognition and memory normal.        Judgment: Judgment normal.     Results for orders placed or performed during the hospital encounter of 04/22/22  Urinalysis, Routine w reflex microscopic Urine, Clean Catch  Result Value Ref Range   Color, Urine STRAW (A) YELLOW   APPearance CLEAR CLEAR   Specific Gravity, Urine 1.005 1.005 - 1.030   pH 7.0 5.0 - 8.0   Glucose, UA NEGATIVE NEGATIVE mg/dL   Hgb urine dipstick NEGATIVE NEGATIVE    Bilirubin Urine NEGATIVE NEGATIVE   Ketones, ur NEGATIVE NEGATIVE mg/dL   Protein, ur NEGATIVE NEGATIVE mg/dL   Nitrite NEGATIVE NEGATIVE   Leukocytes,Ua NEGATIVE NEGATIVE  CBC with Differential  Result Value Ref Range   WBC 10.9 (H) 4.0 - 10.5 K/uL   RBC 4.44 3.87 - 5.11 MIL/uL   Hemoglobin 13.6 12.0 - 15.0 g/dL   HCT 40.1 36.0 - 46.0 %   MCV 90.3 80.0 - 100.0 fL   MCH 30.6 26.0 - 34.0 pg   MCHC 33.9 30.0 - 36.0 g/dL   RDW 12.9 11.5 - 15.5 %   Platelets 247 150 - 400 K/uL   nRBC 0.0 0.0 - 0.2 %   Neutrophils Relative % 56 %   Neutro Abs 6.2 1.7 - 7.7 K/uL   Lymphocytes Relative 34 %   Lymphs Abs 3.7 0.7 - 4.0 K/uL   Monocytes Relative 6 %   Monocytes Absolute 0.7 0.1 - 1.0 K/uL   Eosinophils Relative 2 %   Eosinophils Absolute 0.2 0.0 - 0.5 K/uL   Basophils Relative 1 %   Basophils Absolute 0.1 0.0 - 0.1 K/uL   Immature Granulocytes 1 %   Abs Immature Granulocytes 0.05 0.00 - 0.07 K/uL  Basic metabolic panel  Result Value Ref Range   Sodium 136 135 - 145 mmol/L   Potassium 3.6 3.5 - 5.1 mmol/L   Chloride 105 98 - 111 mmol/L  CO2 25 22 - 32 mmol/L   Glucose, Bld 96 70 - 99 mg/dL   BUN 8 6 - 20 mg/dL   Creatinine, Ser 1.00 0.44 - 1.00 mg/dL   Calcium 8.4 (L) 8.9 - 10.3 mg/dL   GFR, Estimated >60 >60 mL/min   Anion gap 6 5 - 15  Hepatic function panel  Result Value Ref Range   Total Protein 6.7 6.5 - 8.1 g/dL   Albumin 3.9 3.5 - 5.0 g/dL   AST 73 (H) 15 - 41 U/L   ALT 217 (H) 0 - 44 U/L   Alkaline Phosphatase 84 38 - 126 U/L   Total Bilirubin 0.1 (L) 0.3 - 1.2 mg/dL   Bilirubin, Direct 0.1 0.0 - 0.2 mg/dL   Indirect Bilirubin 0.0 (L) 0.3 - 0.9 mg/dL  Lipase, blood  Result Value Ref Range   Lipase 48 11 - 51 U/L  POC Urine Pregnancy, ED (not at [facility])  Result Value Ref Range   Preg Test, Ur NEGATIVE NEGATIVE       Pertinent labs & imaging results that were available during my care of the patient were reviewed by me and considered in my medical decision  making.  Assessment & Plan:  Al[place] was seen today for cough, wheezing and shortness of breath.  Diagnoses and all orders for this visit:  URI with cough and congestion Wheezing  Ongoing and worsening symptoms. Will burst with steroids and treat with Augmentin and Albuterol inhaler as prescribed. Pt aware to report new, worsening, or persistent symptoms. Follow up if not improving.  -     amoxicillin-clavulanate (AUGMENTIN) 875-125 MG tablet; Take 1 tablet by mouth 2 (two) times daily. -     albuterol (VENTOLIN HFA) 108 (90 Base) MCG/ACT inhaler; Inhale 2 puffs into the lungs every 6 (six) hours as needed for wheezing or shortness of breath. -     methylPREDNISolone acetate (DEPO-MEDROL) injection 40 mg  Continue all other maintenance medications.  Follow up plan: Return if symptoms worsen or fail to improve.   Continue healthy lifestyle choices, including diet (rich in fruits, vegetables, and lean proteins, and low in salt and simple carbohydrates) and exercise (at least 30 minutes of moderate physical activity daily).  Educational handout given for URI  The above assessment and management plan was discussed with the patient. The patient verbalized understanding of and has agreed to the management plan. Patient is aware to call the clinic if they develop any new symptoms or if symptoms persist or worsen. Patient is aware when to return to the clinic for a follow-up visit. Patient educated on when it is appropriate to go to the emergency department.   Monia Pouch, FNP-C [facility] [phone]

## 2022-11-24 ENCOUNTER — Telehealth: Payer: Self-pay | Admitting: Family Medicine

## 2022-11-24 NOTE — Telephone Encounter (Signed)
Called patient to get more information regarding FMLA paperwork received from her employer.  LMOVM to return call.

## 2023-03-13 ENCOUNTER — Emergency Department (HOSPITAL_COMMUNITY): Payer: 59

## 2023-03-13 ENCOUNTER — Emergency Department (HOSPITAL_COMMUNITY)
Admission: EM | Admit: 2023-03-13 | Discharge: 2023-03-13 | Disposition: A | Discharge status: Home / Self Care | Payer: 59 | Attending: Emergency Medicine | Admitting: Emergency Medicine

## 2023-03-13 ENCOUNTER — Encounter (HOSPITAL_COMMUNITY): Payer: Self-pay

## 2023-03-13 ENCOUNTER — Other Ambulatory Visit: Payer: Self-pay

## 2023-03-13 DIAGNOSIS — Z91040 Latex allergy status: Secondary | ICD-10-CM | POA: Insufficient documentation

## 2023-03-13 DIAGNOSIS — R079 Chest pain, unspecified: Secondary | ICD-10-CM | POA: Diagnosis present

## 2023-03-13 DIAGNOSIS — R0789 Other chest pain: Secondary | ICD-10-CM | POA: Diagnosis not present

## 2023-03-13 DIAGNOSIS — K209 Esophagitis, unspecified without bleeding: Secondary | ICD-10-CM | POA: Insufficient documentation

## 2023-03-13 LAB — COMPREHENSIVE METABOLIC PANEL
ALT: 214 U/L — ABNORMAL HIGH (ref 0–44)
AST: 122 U/L — ABNORMAL HIGH (ref 15–41)
Albumin: 4.2 g/dL (ref 3.5–5.0)
Alkaline Phosphatase: 71 U/L (ref 38–126)
Anion gap: 8 (ref 5–15)
BUN: 13 mg/dL (ref 6–20)
CO2: 23 mmol/L (ref 22–32)
Calcium: 8.4 mg/dL — ABNORMAL LOW (ref 8.9–10.3)
Chloride: 104 mmol/L (ref 98–111)
Creatinine, Ser: 0.75 mg/dL (ref 0.44–1.00)
GFR, Estimated: >60 mL/min (ref >60)
Glucose, Bld: 109 mg/dL — ABNORMAL HIGH (ref 70–99)
Potassium: 4 mmol/L (ref 3.5–5.1)
Sodium: 135 mmol/L (ref 135–145)
Total Bilirubin: 0.4 mg/dL (ref 0.3–1.2)
Total Protein: 7.5 g/dL (ref 6.5–8.1)

## 2023-03-13 LAB — CBC WITH DIFFERENTIAL/PLATELET
Abs Immature Granulocytes: 0.03 10*3/uL (ref 0.00–0.07)
Basophils Absolute: 0 10*3/uL (ref 0.0–0.1)
Basophils Relative: 0 %
Eosinophils Absolute: 0.2 10*3/uL (ref 0.0–0.5)
Eosinophils Relative: 2 %
HCT: 40.9 % (ref 36.0–46.0)
Hemoglobin: 14 g/dL (ref 12.0–15.0)
Immature Granulocytes: 0 %
Lymphocytes Relative: 32 %
Lymphs Abs: 2.5 10*3/uL (ref 0.7–4.0)
MCH: 31.4 pg (ref 26.0–34.0)
MCHC: 34.2 g/dL (ref 30.0–36.0)
MCV: 91.7 fL (ref 80.0–100.0)
Monocytes Absolute: 0.5 10*3/uL (ref 0.1–1.0)
Monocytes Relative: 7 %
Neutro Abs: 4.6 10*3/uL (ref 1.7–7.7)
Neutrophils Relative %: 59 %
Platelets: 242 10*3/uL (ref 150–400)
RBC: 4.46 MIL/uL (ref 3.87–5.11)
RDW: 11.8 % (ref 11.5–15.5)
WBC: 7.9 10*3/uL (ref 4.0–10.5)
nRBC: 0 % (ref 0.0–0.2)

## 2023-03-13 LAB — I-STAT BETA HCG BLOOD, ED (MC, WL, AP ONLY): I-stat hCG, quantitative: <5 m[IU]/mL (ref <5)

## 2023-03-13 LAB — LIPASE, BLOOD: Lipase: 32 U/L (ref 11–51)

## 2023-03-13 MED ORDER — SUCRALFATE 1 G PO TABS: 1.0000 g | ORAL_TABLET | Freq: Four times a day (QID) | ORAL | 0 refills | Status: DC

## 2023-03-13 MED ORDER — ALUM & MAG HYDROXIDE-SIMETH 200-200-20 MG/5ML PO SUSP
30.0000 mL | Freq: Once | ORAL | Status: AC
Start: 2023-03-13 — End: 2023-03-13
  Administered 2023-03-13: 30 mL via ORAL
  Filled 2023-03-13: qty 30

## 2023-03-13 MED ORDER — LACTATED RINGERS IV SOLN: INTRAVENOUS | Status: DC

## 2023-03-13 MED ORDER — SUCRALFATE 1 G PO TABS
1.0000 g | ORAL_TABLET | Freq: Once | ORAL | Status: AC
Start: 2023-03-13 — End: 2023-03-13
  Administered 2023-03-13: 1 g via ORAL
  Filled 2023-03-13: qty 1

## 2023-03-13 NOTE — ED Triage Notes (Signed)
C/o substernal cp that radiating through to back with inhalation with sob and dizziness when standing or turning to fast x1 week. Pt reports pain feeling like heartburn and using tums w/o relief.  Pt reports feeling diaphoretic and clammy.  Daily drinker and daily vape use.

## 2023-03-13 NOTE — ED Provider Notes (Addendum)
[place] EMERGENCY DEPARTMENT AT [facility] Provider Note   CSN: [id number] Arrival date & time: 03/13/23  [id number]     History  Chief Complaint  Patient presents with   Chest Pain    Jean Campbell is a 32 y.o. female.  32 year old female who presents with epigastric pain and upper chest pain for the past week.  Got worse today.  States it feels like heartburn.  Notes that she drinks 8 beers a day.  Denies any hematemesis.  No bloody stools.  No current emesis.  Today while at work she got diaphoretic due to the pain.  Symptoms resolved on their own and lasted for less than 10 minutes.  Denies any fever, cough, congestion.       Home Medications Prior to Admission medications   Medication Sig Start Date End Date Taking? Authorizing Provider  acetaminophen (TYLENOL) 500 MG tablet Take 1 tablet (500 mg total) by mouth every 6 (six) hours as needed. Patient not taking: Reported on 11/15/2022 05/10/21   Daryll Drown, NP  albuterol (VENTOLIN HFA) 108 (90 Base) MCG/ACT inhaler Inhale 2 puffs into the lungs every 6 (six) hours as needed for wheezing or shortness of breath. 11/15/22   Sonny Masters, FNP  amoxicillin-clavulanate (AUGMENTIN) 875-125 MG tablet Take 1 tablet by mouth 2 (two) times daily. 11/15/22   Sonny Masters, FNP      Allergies    Tilactase, Latex, and Sulfa antibiotics    Review of Systems   Review of Systems  All other systems reviewed and are negative.   Physical Exam Updated Vital Signs BP 128/79   Pulse 68   Temp 98 F (36.7 C)   Resp 17   Wt 81 kg   SpO2 100%   BMI 31.63 kg/m  Physical Exam Vitals and nursing note reviewed.  Constitutional:      General: She is not in acute distress.    Appearance: Normal appearance. She is well-developed. She is not toxic-appearing.  HENT:     Head: Normocephalic and atraumatic.  Eyes:     General: Lids are normal.     Conjunctiva/sclera: Conjunctivae normal.     Pupils: Pupils are equal,  round, and reactive to light.  Neck:     Thyroid: No thyroid mass.     Trachea: No tracheal deviation.  Cardiovascular:     Rate and Rhythm: Normal rate and regular rhythm.     Heart sounds: Normal heart sounds. No murmur heard.    No gallop.  Pulmonary:     Effort: Pulmonary effort is normal. No respiratory distress.     Breath sounds: Normal breath sounds. No stridor. No decreased breath sounds, wheezing, rhonchi or rales.  Abdominal:     General: There is no distension.     Palpations: Abdomen is soft.     Tenderness: There is no abdominal tenderness. There is no rebound.  Musculoskeletal:        General: No tenderness. Normal range of motion.     Cervical back: Normal range of motion and neck supple.  Skin:    General: Skin is warm and dry.     Findings: No abrasion or rash.  Neurological:     Mental Status: She is alert and oriented to person, place, and time. Mental status is at baseline.     GCS: GCS eye subscore is 4. GCS verbal subscore is 5. GCS motor subscore is 6.     Cranial Nerves: No cranial  nerve deficit.     Sensory: No sensory deficit.     Motor: Motor function is intact.  Psychiatric:        Attention and Perception: Attention normal.        Speech: Speech normal.        Behavior: Behavior normal.     ED Results / Procedures / Treatments   Labs (all labs ordered are listed, but only abnormal results are displayed) Labs Reviewed - No data to display  EKG EKG Interpretation  Date/Time:  Monday March 13 2023 09:19:51 EDT Ventricular Rate:  73 PR Interval:  157 QRS Duration: 92 QT Interval:  413 QTC Calculation: 456 R Axis:   52 Text Interpretation: Sinus rhythm Baseline wander in lead(s) V3 V5 V6 Confirmed by Lorre Nick ([id number]) on 03/13/2023 9:32:23 AM  Radiology No results found.  Procedures Procedures    Medications Ordered in ED Medications  lactated ringers infusion (has no administration in time range)  sucralfate (CARAFATE) tablet 1  g (has no administration in time range)  alum & mag hydroxide-simeth (MAALOX/MYLANTA) 200-200-20 MG/5ML suspension 30 mL (has no administration in time range)    ED Course/ Medical Decision Making/ A&P                             Medical Decision Making Amount and/or Complexity of Data Reviewed Labs: ordered. Radiology: ordered.  Risk OTC drugs. Prescription drug management.   Patient is EKG per my interpretation shows sinus rhythm.  Chest x-ray per interpretation shows no acute pulmonary disease.  Patient's labs significant for elevated transaminases.  This is consistent with patient's known history of alcohol use.  Patient given GI cocktail and feels much better at this time.  Very low suspicion for ACS or PE.  No concern for esophageal perforation.  Suspect esophagitis from her use of alcohol.  Patient counseled about her alcohol use as well to.  Will discharge home at this time        Final Clinical Impression(s) / ED Diagnoses Final diagnoses:  None    Rx / DC Orders ED Discharge Orders     None         Lorre Nick, MD 03/13/23 1032    Lorre Nick, MD 03/13/23 1032

## 2023-03-14 ENCOUNTER — Institutional Professional Consult (permissible substitution): Payer: Self-pay | Admitting: Plastic Surgery

## 2023-03-14 ENCOUNTER — Telehealth: Payer: Self-pay

## 2023-03-14 NOTE — Transitions of Care (Post Inpatient/ED Visit) (Unsigned)
   03/14/2023  Name: Jean Campbell MRN: [id number] DOB: [birth year]  Today's TOC FU Call Status: Today's TOC FU Call Status:: Unsuccessul Call (1st Attempt) Unsuccessful Call (1st Attempt) Date: 03/14/23  Attempted to reach the patient regarding the most recent Inpatient/ED visit.  Follow Up Plan: Additional outreach attempts will be made to reach the patient to complete the Transitions of Care (Post Inpatient/ED visit) call.   Signature Karena Addison, LPN [facility] Nurse Health Advisor Direct Dial [phone]

## 2023-03-15 NOTE — Transitions of Care (Post Inpatient/ED Visit) (Signed)
   03/15/2023  Name: Jean Campbell MRN: [id number] DOB: [birth year]  Today's TOC FU Call Status: Today's TOC FU Call Status:: Unsuccessful Call (2nd Attempt) Unsuccessful Call (1st Attempt) Date: 03/14/23 Unsuccessful Call (2nd Attempt) Date: 03/15/23  Attempted to reach the patient regarding the most recent Inpatient/ED visit.  Follow Up Plan: No further outreach attempts will be made at this time. We have been unable to contact the patient.  Signature Karena Addison, LPN [facility] Nurse Health Advisor Direct Dial [phone]

## 2023-04-30 ENCOUNTER — Emergency Department (HOSPITAL_COMMUNITY): Payer: 59

## 2023-04-30 ENCOUNTER — Emergency Department (HOSPITAL_COMMUNITY)
Admission: EM | Admit: 2023-04-30 | Discharge: 2023-05-01 | Disposition: A | Discharge status: Home / Self Care | Payer: 59 | Attending: Emergency Medicine | Admitting: Emergency Medicine

## 2023-04-30 ENCOUNTER — Encounter (HOSPITAL_COMMUNITY): Payer: Self-pay

## 2023-04-30 DIAGNOSIS — S99922A Unspecified injury of left foot, initial encounter: Secondary | ICD-10-CM | POA: Diagnosis not present

## 2023-04-30 DIAGNOSIS — R000 Tachycardia, unspecified: Secondary | ICD-10-CM | POA: Insufficient documentation

## 2023-04-30 DIAGNOSIS — M79604 Pain in right leg: Secondary | ICD-10-CM | POA: Diagnosis present

## 2023-04-30 DIAGNOSIS — Z043 Encounter for examination and observation following other accident: Secondary | ICD-10-CM | POA: Diagnosis not present

## 2023-04-30 DIAGNOSIS — M7731 Calcaneal spur, right foot: Secondary | ICD-10-CM | POA: Diagnosis not present

## 2023-04-30 DIAGNOSIS — S99921A Unspecified injury of right foot, initial encounter: Secondary | ICD-10-CM | POA: Diagnosis not present

## 2023-04-30 DIAGNOSIS — M549 Dorsalgia, unspecified: Secondary | ICD-10-CM | POA: Diagnosis not present

## 2023-04-30 LAB — POC URINE PREG, ED: Preg Test, Ur: NEGATIVE

## 2023-04-30 MED ORDER — HYDROMORPHONE HCL 1 MG/ML IJ SOLN
1.0000 mg | Freq: Once | INTRAMUSCULAR | Status: AC
  Administered 2023-04-30: 1 mg via INTRAMUSCULAR
  Filled 2023-04-30: qty 1

## 2023-04-30 MED ORDER — HYDROCODONE-ACETAMINOPHEN 5-325 MG PO TABS: 2.0000 | ORAL_TABLET | Freq: Four times a day (QID) | ORAL | 0 refills | Status: DC | PRN

## 2023-04-30 NOTE — ED Provider Notes (Signed)
Testing shows no other new fractures including lumbar spine or ankles.  Patient informed of her results, she has requested bilateral Cam walkers, she is stable for discharge, pain medications prescribed   Eber Hong, MD 04/30/23 2319

## 2023-04-30 NOTE — ED Triage Notes (Signed)
Pt was jumping off of a rope swing, landed with both heels on a rock. Tearful in triage.

## 2023-04-30 NOTE — Progress Notes (Signed)
Orthopedic Tech Progress Note Patient Details:  Jean Campbell 01/05/1991 [id number]  Ortho Devices Type of Ortho Device: CAM walker Ortho Device/Splint Location: rle Ortho Device/Splint Interventions: Ordered, Application, Adjustment  Boot applied and crutches left in room. Told the rn to call after the next xrays if I was needed for the crutches. Post Interventions Patient Tolerated: Well Instructions Provided: Care of device, Adjustment of device  Trinna Post 04/30/2023, 9:46 PM

## 2023-04-30 NOTE — ED Notes (Signed)
Ortho contacted  °

## 2023-04-30 NOTE — ED Provider Notes (Signed)
[place] EMERGENCY DEPARTMENT AT [facility] Provider Note   CSN: [id number] Arrival date & time: 04/30/23  1550     History  Chief Complaint  Patient presents with   Foot Pain    Jean Campbell is a 32 y.o. female.  Patient here with bilateral heel and foot pain after jumping off of a embankment about 5 to 7 feet onto rocks.  Contrary to triage note she was not using a rope swing.  She landed on her bilateral heels.  Did not fall or hit her head.  No neck or back pain.  No chest pain, abdominal pain, nausea or vomiting.  Has bilateral heel and plantar foot pain right greater than left.  Difficulty with ambulation.  Did not take anything for pain.  No focal weakness, numbness or tingling.   The history is provided by the patient.  Foot Pain Pertinent negatives include no chest pain, no abdominal pain, no headaches and no shortness of breath.       Home Medications Prior to Admission medications   Medication Sig Start Date End Date Taking? Authorizing Provider  acetaminophen (TYLENOL) 500 MG tablet Take 1 tablet (500 mg total) by mouth every 6 (six) hours as needed. Patient not taking: Reported on 11/15/2022 05/10/21   Daryll Drown, NP  albuterol (VENTOLIN HFA) 108 (90 Base) MCG/ACT inhaler Inhale 2 puffs into the lungs every 6 (six) hours as needed for wheezing or shortness of breath. 11/15/22   Sonny Masters, FNP  amoxicillin-clavulanate (AUGMENTIN) 875-125 MG tablet Take 1 tablet by mouth 2 (two) times daily. 11/15/22   Sonny Masters, FNP  sucralfate (CARAFATE) 1 g tablet Take 1 tablet (1 g total) by mouth 4 (four) times daily. 03/13/23   Lorre Nick, MD      Allergies    Tilactase, Latex, and Sulfa antibiotics    Review of Systems   Review of Systems  Constitutional:  Negative for activity change, appetite change and fever.  HENT:  Negative for congestion.   Respiratory:  Negative for cough, chest tightness and shortness of breath.   Cardiovascular:   Negative for chest pain.  Gastrointestinal:  Negative for abdominal pain, nausea and vomiting.  Genitourinary:  Negative for dysuria and hematuria.  Musculoskeletal:  Positive for arthralgias and myalgias. Negative for back pain.  Skin:  Negative for rash.  Neurological:  Negative for dizziness, weakness and headaches.    all other systems are negative except as noted in the HPI and PMH.   Physical Exam Updated Vital Signs BP 124/65 (BP Location: Right Arm)   Pulse (!) 128   Temp 98.5 F (36.9 C)   Resp (!) 23   Ht 5\' 3"  (1.6 m)   Wt 82.6 kg   LMP 04/12/2023   SpO2 99%   BMI 32.24 kg/m  Physical Exam Vitals and nursing note reviewed.  Constitutional:      General: She is not in acute distress.    Appearance: She is well-developed.  HENT:     Head: Normocephalic and atraumatic.     Mouth/Throat:     Pharynx: No oropharyngeal exudate.  Eyes:     Conjunctiva/sclera: Conjunctivae normal.     Pupils: Pupils are equal, round, and reactive to light.  Neck:     Comments: No midline C-spine Cardiovascular:     Rate and Rhythm: Regular rhythm. Tachycardia present.     Heart sounds: Normal heart sounds. No murmur heard. Pulmonary:     Effort:  Pulmonary effort is normal. No respiratory distress.     Breath sounds: Normal breath sounds.  Abdominal:     Palpations: Abdomen is soft.     Tenderness: There is no abdominal tenderness. There is no guarding or rebound.  Musculoskeletal:        General: Tenderness present. Normal range of motion.     Cervical back: Normal range of motion and neck supple.     Comments: No T or L-spine pain  Tenderness to plantar feet bilaterally, right greater than left.  Able to wiggle toes.  Ankle flexion and extension are intact.  Intact DP and PT pulses bilaterally.  Medial malleoli tenderness bilaterally.  No ankle deformity.  Full range of motion bilateral hips and knees.  Skin:    General: Skin is warm.  Neurological:     Mental Status: She  is alert and oriented to person, place, and time.     Cranial Nerves: No cranial nerve deficit.     Motor: No abnormal muscle tone.     Coordination: Coordination normal.     Comments:  5/5 strength throughout. CN 2-12 intact.Equal grip strength.   Psychiatric:        Behavior: Behavior normal.     ED Results / Procedures / Treatments   Labs (all labs ordered are listed, but only abnormal results are displayed) Labs Reviewed - No data to display  EKG None  Radiology DG Foot Complete Right  Result Date: 04/30/2023 CLINICAL DATA:  About a rope swing and injured both feet today. Landed with both heels on a rock. Pain. EXAM: RIGHT FOOT COMPLETE - 3+ VIEW COMPARISON:  Right foot radiographs 07/29/2014 FINDINGS: Minimal lateral great toe metatarsophalangeal joint degenerative spurring. Redemonstration of mild-to-moderate plantar calcaneal heel spur. There is new vertical oriented lucency at the base of this calcaneal heel spur suggesting an acute nondisplaced fracture. Minimal chronic enthesopathic change at the Achilles insertion on the calcaneus. No dislocation. IMPRESSION: Acute nondisplaced fracture of the base of a mild-to-moderate plantar calcaneal heel spur. Electronically Signed   By: Neita Garnet M.D.   On: 04/30/2023 17:05   DG Foot Complete Left  Result Date: 04/30/2023 CLINICAL DATA:  Jumped off a rope swing and injured both feet today. Landing on both heels on a rock. EXAM: LEFT FOOT - COMPLETE 3+ VIEW COMPARISON:  Left foot radiographs 05/23/2017 FINDINGS: There is again a moderate plantar calcaneal heel spur. Interval healing of the prior acute undisplaced fracture seen in this region. Joint spaces are preserved. No acute fracture is seen. No dislocation. IMPRESSION: 1. No acute fracture. 2. Moderate plantar calcaneal heel spur. Interval healing of the prior plantar calcaneal heel spur fracture. Electronically Signed   By: Neita Garnet M.D.   On: 04/30/2023 17:04     Procedures Procedures    Medications Ordered in ED Medications  HYDROmorphone (DILAUDID) injection 1 mg (has no administration in time range)    ED Course/ Medical Decision Making/ A&P                             Medical Decision Making Amount and/or Complexity of Data Reviewed Labs: ordered. Decision-making details documented in ED Course. Radiology: ordered and independent interpretation performed. Decision-making details documented in ED Course. ECG/medicine tests: ordered and independent interpretation performed. Decision-making details documented in ED Course.  Risk Prescription drug management.  Bilateral heel pain after jumping from a rock ledge.  Did not hit head or lose  consciousness.  No midline neck or back pain.  No open wounds.  Neurovascular intact.  X-ray in triage concerning for fracture of heel spur of right foot.  Tachycardia will need to be rechecked.  Suspect likely due to pain. X-rays of ankles and lumbar spine pending at shift change.  Anticipate discharge home if reassuring.  Patient to be placed in fracture boot, crutches, pain control, ice, elevation and orthopedic/podiatry follow-up.        Final Clinical Impression(s) / ED Diagnoses Final diagnoses:  Calcaneal spur of right foot    Rx / DC Orders ED Discharge Orders     None         Nickalus Thornsberry, Jeannett Senior, MD 04/30/23 2057

## 2023-04-30 NOTE — ED Notes (Signed)
Ortho tech paged  

## 2023-04-30 NOTE — Discharge Instructions (Addendum)
There is a fracture of the heel spur of your right foot.  Wear the boot as prescribed and follow-up with the orthopedic doctor for further evaluation.  Use ice, anti-inflammatories and pain medication as prescribed.  Return to the ED with worsening pain, weakness, numbness, tingling, other concerns

## 2023-04-30 NOTE — Progress Notes (Signed)
Orthopedic Tech Progress Note Patient Details:  Jean Campbell 10/09/1991 [id number]  Patient ID: Jean Campbell, female   DOB: November 28, 1991, 32 y.o.   MRN: [id number] I tried to get the patient up for crutch training. They were unable to bear weight at all. I alerted the dr. Trinna Post 04/30/2023, 11:21 PM

## 2023-05-04 ENCOUNTER — Other Ambulatory Visit: Payer: Self-pay | Admitting: Orthopaedic Surgery

## 2023-05-04 ENCOUNTER — Ambulatory Visit
Admission: RE | Admit: 2023-05-04 | Discharge: 2023-05-04 | Disposition: A | Discharge status: Home / Self Care | Payer: 59 | Source: Ambulatory Visit | Attending: Orthopaedic Surgery | Admitting: Orthopaedic Surgery

## 2023-05-04 DIAGNOSIS — S93602A Unspecified sprain of left foot, initial encounter: Secondary | ICD-10-CM | POA: Diagnosis not present

## 2023-05-04 DIAGNOSIS — M25572 Pain in left ankle and joints of left foot: Secondary | ICD-10-CM

## 2023-05-04 DIAGNOSIS — R936 Abnormal findings on diagnostic imaging of limbs: Secondary | ICD-10-CM | POA: Diagnosis not present

## 2023-05-04 DIAGNOSIS — S92001A Unspecified fracture of right calcaneus, initial encounter for closed fracture: Secondary | ICD-10-CM | POA: Diagnosis not present

## 2023-05-04 DIAGNOSIS — S93601A Unspecified sprain of right foot, initial encounter: Secondary | ICD-10-CM | POA: Diagnosis not present

## 2023-05-04 DIAGNOSIS — M84372A Stress fracture, left ankle, initial encounter for fracture: Secondary | ICD-10-CM | POA: Diagnosis not present

## 2023-05-04 DIAGNOSIS — M25571 Pain in right ankle and joints of right foot: Secondary | ICD-10-CM | POA: Diagnosis not present

## 2023-05-10 DIAGNOSIS — M79671 Pain in right foot: Secondary | ICD-10-CM | POA: Diagnosis not present

## 2023-05-10 DIAGNOSIS — M79672 Pain in left foot: Secondary | ICD-10-CM | POA: Diagnosis not present

## 2023-05-16 DIAGNOSIS — S93601A Unspecified sprain of right foot, initial encounter: Secondary | ICD-10-CM | POA: Diagnosis not present

## 2023-05-16 DIAGNOSIS — M79671 Pain in right foot: Secondary | ICD-10-CM | POA: Diagnosis not present

## 2023-05-16 DIAGNOSIS — S92001A Unspecified fracture of right calcaneus, initial encounter for closed fracture: Secondary | ICD-10-CM | POA: Diagnosis not present

## 2023-05-16 DIAGNOSIS — S9032XA Contusion of left foot, initial encounter: Secondary | ICD-10-CM | POA: Diagnosis not present

## 2023-06-01 DIAGNOSIS — S92001A Unspecified fracture of right calcaneus, initial encounter for closed fracture: Secondary | ICD-10-CM | POA: Diagnosis not present

## 2023-06-01 DIAGNOSIS — M79671 Pain in right foot: Secondary | ICD-10-CM | POA: Diagnosis not present

## 2023-06-12 ENCOUNTER — Ambulatory Visit: Payer: Self-pay | Admitting: Family Medicine

## 2023-06-14 ENCOUNTER — Ambulatory Visit (INDEPENDENT_AMBULATORY_CARE_PROVIDER_SITE_OTHER): Payer: 59 | Admitting: Family Medicine

## 2023-06-14 ENCOUNTER — Encounter: Payer: Self-pay | Admitting: Family Medicine

## 2023-06-14 VITALS — BP 117/77 | HR 88 | Temp 97.7°F | Resp 20 | Ht 63.0 in | Wt 183.2 lb

## 2023-06-14 DIAGNOSIS — R300 Dysuria: Secondary | ICD-10-CM | POA: Diagnosis not present

## 2023-06-14 DIAGNOSIS — B3731 Acute candidiasis of vulva and vagina: Secondary | ICD-10-CM | POA: Diagnosis not present

## 2023-06-14 DIAGNOSIS — N644 Mastodynia: Secondary | ICD-10-CM

## 2023-06-14 DIAGNOSIS — J029 Acute pharyngitis, unspecified: Secondary | ICD-10-CM | POA: Diagnosis not present

## 2023-06-14 DIAGNOSIS — N3000 Acute cystitis without hematuria: Secondary | ICD-10-CM

## 2023-06-14 DIAGNOSIS — N898 Other specified noninflammatory disorders of vagina: Secondary | ICD-10-CM

## 2023-06-14 DIAGNOSIS — N6320 Unspecified lump in the left breast, unspecified quadrant: Secondary | ICD-10-CM

## 2023-06-14 LAB — URINALYSIS, COMPLETE
Bilirubin, UA: NEGATIVE
Nitrite, UA: POSITIVE — AB
RBC, UA: NEGATIVE
Specific Gravity, UA: 1.015 (ref 1.005–1.030)
Urobilinogen, Ur: 2 mg/dL — ABNORMAL HIGH (ref 0.2–1.0)
pH, UA: 5 (ref 5.0–7.5)

## 2023-06-14 LAB — RAPID STREP SCREEN (MED CTR MEBANE ONLY): Strep Gp A Ag, IA W/Reflex: NEGATIVE

## 2023-06-14 LAB — MICROSCOPIC EXAMINATION: Renal Epithel, UA: NONE SEEN /hpf

## 2023-06-14 LAB — CULTURE, GROUP A STREP

## 2023-06-14 MED ORDER — FLUCONAZOLE 150 MG PO TABS
150.0000 mg | ORAL_TABLET | Freq: Once | ORAL | 0 refills | Status: DC
Start: 2023-06-14 — End: 2023-06-14

## 2023-06-14 MED ORDER — CEPHALEXIN 500 MG PO CAPS
500.0000 mg | ORAL_CAPSULE | Freq: Two times a day (BID) | ORAL | 0 refills | Status: DC
Start: 2023-06-14 — End: 2023-06-15

## 2023-06-14 MED ORDER — CEPHALEXIN 500 MG PO CAPS
500.0000 mg | ORAL_CAPSULE | Freq: Two times a day (BID) | ORAL | 0 refills | Status: DC
Start: 2023-06-14 — End: 2023-06-14

## 2023-06-14 MED ORDER — FLUCONAZOLE 150 MG PO TABS
150.0000 mg | ORAL_TABLET | Freq: Once | ORAL | 0 refills | Status: DC
Start: 2023-06-14 — End: 2023-06-15

## 2023-06-14 NOTE — Progress Notes (Signed)
Acute Office Visit  Subjective:     Patient ID: Jean Campbell, female    DOB: 18-May-1991, 32 y.o.   MRN: [id number]  Chief Complaint  Patient presents with   Urinary Tract Infection   Sore Throat    Urinary Tract Infection  This is a new problem. The current episode started in the past 7 days. The problem occurs intermittently. The problem has been gradually worsening. The quality of the pain is described as burning. There has been no fever. She is Sexually active. Associated symptoms include a discharge (yellow), frequency, nausea and urgency. Pertinent negatives include no chills, flank pain, hematuria, possible pregnancy (no recent intercourse) or vomiting. Her past medical history is significant for recurrent UTIs.  Sore Throat  This is a new problem. The current episode started in the past 7 days. The problem has been unchanged. There has been no fever. Associated symptoms include abdominal pain (lower), headaches and swollen glands. Pertinent negatives include no congestion, coughing, diarrhea, drooling, ear discharge, ear pain, hoarse voice, shortness of breath, trouble swallowing or vomiting. 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   She also reports that it looked like she had a lump at the bottom of her left breast. She does have tenderness in the left breast. Denies changes in skin, nipple pain or discharge. No family hx of breast cancer.   Review of Systems  Constitutional:  Negative for chills.  HENT:  Negative for congestion, drooling, ear discharge, ear pain, hoarse voice and trouble swallowing.   Respiratory:  Negative for cough and shortness of breath.   Gastrointestinal:  Positive for abdominal pain (lower) and nausea. Negative for diarrhea and vomiting.  Genitourinary:  Positive for frequency and urgency. Negative for flank pain and hematuria.  Neurological:  Positive for headaches.        Objective:    BP 117/77   Pulse 88   Temp 97.7 F (36.5 C) (Oral)   Resp 20    Ht 5\' 3"  (1.6 m)   Wt 183 lb 4 oz (83.1 kg)   SpO2 97%   BMI 32.46 kg/m    Physical Exam Vitals and nursing note reviewed.  Constitutional:      General: She is not in acute distress.    Appearance: She is well-developed. She is not ill-appearing, toxic-appearing or diaphoretic.  HENT:     Head: Normocephalic and atraumatic.     Right Ear: Tympanic membrane and ear canal normal.     Left Ear: Tympanic membrane and ear canal normal.     Nose: Congestion present.     Mouth/Throat:     Mouth: Mucous membranes are moist. No oral lesions.     Pharynx: Posterior oropharyngeal erythema present. No pharyngeal swelling, oropharyngeal exudate or uvula swelling.     Tonsils: No tonsillar exudate or tonsillar abscesses. 1+ on the right. 1+ on the left.  Eyes:     Conjunctiva/sclera: Conjunctivae normal.     Pupils: Pupils are equal, round, and reactive to light.  Neck:     Thyroid: No thyromegaly.  Cardiovascular:     Rate and Rhythm: Normal rate and regular rhythm.     Heart sounds: Normal heart sounds. No murmur heard. Pulmonary:     Effort: Pulmonary effort is normal. No respiratory distress.     Breath sounds: Normal breath sounds. No wheezing, rhonchi or rales.  Chest:  Breasts:    Right: Normal.     Left: Tenderness present. No swelling, bleeding, inverted nipple, mass, nipple  discharge or skin change.  Abdominal:     General: Bowel sounds are normal. There is no distension.     Palpations: Abdomen is soft.     Tenderness: There is no abdominal tenderness. There is no right CVA tenderness or left CVA tenderness.  Musculoskeletal:     Cervical back: Normal range of motion and neck supple.  Lymphadenopathy:     Cervical: No cervical adenopathy.     Upper Body:     Right upper body: No supraclavicular, axillary or pectoral adenopathy.     Left upper body: No supraclavicular, axillary or pectoral adenopathy.  Skin:    General: Skin is warm and dry.  Neurological:      General: No focal deficit present.     Mental Status: She is alert and oriented to person, place, and time.  Psychiatric:        Mood and Affect: Mood normal.        Behavior: Behavior normal.     Results for orders placed or performed in visit on 06/14/23  Rapid Strep Screen ([facility] ONLY)   Specimen: Other   Other  Result Value Ref Range   Strep Gp A Ag, IA W/Reflex Negative Negative  Novel Coronavirus, NAA ([facility])   Specimen: Nasopharyngeal(NP) swabs in vial transport medium  Result Value Ref Range   SARS-CoV-2, NAA Not Detected Not Detected  Microscopic Examination   Urine  Result Value Ref Range   WBC, UA 6-10 (A) 0 - 5 /hpf   RBC, Urine 0-2 0 - 2 /hpf   Epithelial Cells (non renal) 0-10 0 - 10 /hpf   Renal Epithel, UA None seen None seen /hpf   Bacteria, UA Few (A) None seen/Few   Yeast, UA Present (A) None seen  Culture, Group A Strep   Other  Result Value Ref Range   Strep A Culture CANCELED   Urinalysis, Complete  Result Value Ref Range   Specific Gravity, UA 1.015 1.005 - 1.030   pH, UA 5.0 5.0 - 7.5   Color, UA Orange Yellow   Appearance Ur Cloudy (A) Clear   Leukocytes,UA 3+ (A) Negative   Protein,UA 1+ (A) Negative/Trace   Glucose, UA Trace (A) Negative   Ketones, UA Trace (A) Negative   RBC, UA Negative Negative   Bilirubin, UA Negative Negative   Urobilinogen, Ur 2.0 (H) 0.2 - 1.0 mg/dL   Nitrite, UA Positive (A) Negative   Microscopic Examination See below:         Assessment & Plan:   Niyla was seen today for urinary tract infection and sore throat.  Diagnoses and all orders for this visit:  Sore throat Negative rapid strep today. Culture pending for strep, GC/chlamydia. Discussed symptomatic care and return precautions.  -     Rapid Strep Screen ([facility] ONLY) -     Novel Coronavirus, NAA ([facility]) -     Cancel: GC/Chlamydia probe amp ([place])not at [facility] -     Ct/GC NAA, Pharyngeal -     Culture, Group A  Strep  Acute cystitis Keflex as below. Discussed symptomatic care and return precautions.  -     Urine Culture -     Urinalysis, Complete -     Microscopic Examination -     cephALEXin (KEFLEX) 500 MG capsule; Take 1 capsule (500 mg total) by mouth 2 (two) times daily.  Yeast vaginitis Diflucan as below.  -     fluconazole (DIFLUCAN) 150 MG tablet;  Take 1 tablet (150 mg total) by mouth once for 1 dose. May repeat in 3 days if symptoms persist.  Breast pain, left No mass palpated on exam. Mammogram discussed and ordered.  -     MM Digital Diagnostic Bilat; Future    Return for CPE.  The patient indicates understanding of these issues and agrees with the plan.  Gabriel Earing, FNP

## 2023-06-15 DIAGNOSIS — S92001A Unspecified fracture of right calcaneus, initial encounter for closed fracture: Secondary | ICD-10-CM | POA: Diagnosis not present

## 2023-06-15 DIAGNOSIS — S93602A Unspecified sprain of left foot, initial encounter: Secondary | ICD-10-CM | POA: Diagnosis not present

## 2023-06-15 DIAGNOSIS — M79671 Pain in right foot: Secondary | ICD-10-CM | POA: Diagnosis not present

## 2023-06-15 DIAGNOSIS — M79672 Pain in left foot: Secondary | ICD-10-CM | POA: Diagnosis not present

## 2023-06-15 DIAGNOSIS — S93601A Unspecified sprain of right foot, initial encounter: Secondary | ICD-10-CM | POA: Diagnosis not present

## 2023-06-15 DIAGNOSIS — S9032XA Contusion of left foot, initial encounter: Secondary | ICD-10-CM | POA: Diagnosis not present

## 2023-06-15 LAB — NOVEL CORONAVIRUS, NAA: SARS-CoV-2, NAA: NOT DETECTED

## 2023-06-15 MED ORDER — CEPHALEXIN 500 MG PO CAPS
500.0000 mg | ORAL_CAPSULE | Freq: Two times a day (BID) | ORAL | 0 refills | Status: DC
Start: 2023-06-15 — End: 2023-08-25

## 2023-06-15 MED ORDER — FLUCONAZOLE 150 MG PO TABS
150.0000 mg | ORAL_TABLET | Freq: Once | ORAL | 0 refills | Status: AC
Start: 2023-06-15 — End: 2023-06-15

## 2023-06-15 NOTE — Progress Notes (Signed)
Refills failed. resent 

## 2023-06-16 ENCOUNTER — Encounter: Payer: Self-pay | Admitting: Family Medicine

## 2023-06-16 ENCOUNTER — Ambulatory Visit (INDEPENDENT_AMBULATORY_CARE_PROVIDER_SITE_OTHER): Payer: 59 | Admitting: Family Medicine

## 2023-06-16 VITALS — BP 112/74 | HR 76 | Temp 97.6°F | Resp 20 | Ht 63.0 in | Wt 183.2 lb

## 2023-06-16 DIAGNOSIS — Z113 Encounter for screening for infections with a predominantly sexual mode of transmission: Secondary | ICD-10-CM

## 2023-06-16 DIAGNOSIS — Z130 Encounter for screening for diseases of the blood and blood-forming organs and certain disorders involving the immune mechanism: Secondary | ICD-10-CM | POA: Diagnosis not present

## 2023-06-16 DIAGNOSIS — Z1329 Encounter for screening for other suspected endocrine disorder: Secondary | ICD-10-CM

## 2023-06-16 DIAGNOSIS — Z13220 Encounter for screening for lipoid disorders: Secondary | ICD-10-CM | POA: Diagnosis not present

## 2023-06-16 DIAGNOSIS — F1010 Alcohol abuse, uncomplicated: Secondary | ICD-10-CM

## 2023-06-16 DIAGNOSIS — R7989 Other specified abnormal findings of blood chemistry: Secondary | ICD-10-CM | POA: Diagnosis not present

## 2023-06-16 DIAGNOSIS — Z0000 Encounter for general adult medical examination without abnormal findings: Secondary | ICD-10-CM | POA: Diagnosis not present

## 2023-06-16 DIAGNOSIS — Z13228 Encounter for screening for other metabolic disorders: Secondary | ICD-10-CM | POA: Diagnosis not present

## 2023-06-16 DIAGNOSIS — Z862 Personal history of diseases of the blood and blood-forming organs and certain disorders involving the immune mechanism: Secondary | ICD-10-CM

## 2023-06-16 NOTE — Patient Instructions (Addendum)
[facility] [address] [place] [phone]  [facility] [address]  [phone]  [facility] [address] [phone]   Health Maintenance, Female Adopting a healthy lifestyle and getting preventive care are important in promoting health and wellness. Ask your health care provider about: The right schedule for you to have regular tests and exams. Things you can do on your own to prevent diseases and keep yourself healthy. What should I know about diet, weight, and exercise? Eat a healthy diet  Eat a diet that includes plenty of vegetables, fruits, low-fat dairy products, and lean protein. Do not eat a lot of foods that are high in solid fats, added sugars, or sodium. Maintain a healthy weight Body mass index (BMI) is used to identify weight problems. It estimates body fat based on height and weight. Your health care provider can help determine your BMI and help you achieve or maintain a healthy weight. Get regular exercise Get regular exercise. This is one of the most important things you can do for your health. Most adults should: Exercise for at least 150 minutes each week. The exercise should increase your heart rate and make you sweat (moderate-intensity exercise). Do strengthening exercises at least twice a week. This is in addition to the moderate-intensity exercise. Spend less time sitting. Even light physical activity can be beneficial. Watch cholesterol and blood lipids Have your blood tested for lipids and cholesterol at 34 years of age, then have this test every 5 years. Have your cholesterol levels checked more often if: Your lipid or cholesterol levels are high. You are older than 34 years of age. You are at high risk for heart disease. What should I know about cancer screening? Depending on your health history and family history, you may need to have cancer screening at various ages. This may include screening  for: Breast cancer. Cervical cancer. Colorectal cancer. Skin cancer. Lung cancer. What should I know about heart disease, diabetes, and high blood pressure? Blood pressure and heart disease High blood pressure causes heart disease and increases the risk of stroke. This is more likely to develop in people who have high blood pressure readings or are overweight. Have your blood pressure checked: Every 3-5 years if you are 34-34 years of age. Every year if you are 9 years old or older. Diabetes Have regular diabetes screenings. This checks your fasting blood sugar level. Have the screening done: Once every three years after age 40 if you are at a normal weight and have a low risk for diabetes. More often and at a younger age if you are overweight or have a high risk for diabetes. What should I know about preventing infection? Hepatitis B If you have a higher risk for hepatitis B, you should be screened for this virus. Talk with your health care provider to find out if you are at risk for hepatitis B infection. Hepatitis C Testing is recommended for: Everyone born from [birth year] through 1965. Anyone with known risk factors for hepatitis C. Sexually transmitted infections (STIs) Get screened for STIs, including gonorrhea and chlamydia, if: You are sexually active and are younger than 34 years of age. You are older than 34 years of age and your health care provider tells you that you are at risk for this type of infection. Your sexual activity has changed since you were last screened, and you are at increased risk for chlamydia or gonorrhea. Ask your health care provider if you are at  risk. Ask your health care provider about whether you are at high risk for HIV. Your health care provider may recommend a prescription medicine to help prevent HIV infection. If you choose to take medicine to prevent HIV, you should first get tested for HIV. You should then be tested every 3 months for as long as you  are taking the medicine. Pregnancy If you are about to stop having your period (premenopausal) and you may become pregnant, seek counseling before you get pregnant. Take 400 to 800 micrograms (mcg) of folic acid every day if you become pregnant. Ask for birth control (contraception) if you want to prevent pregnancy. Osteoporosis and menopause Osteoporosis is a disease in which the bones lose minerals and strength with aging. This can result in bone fractures. If you are 34 years old or older, or if you are at risk for osteoporosis and fractures, ask your health care provider if you should: Be screened for bone loss. Take a calcium or vitamin D supplement to lower your risk of fractures. Be given hormone replacement therapy (HRT) to treat symptoms of menopause. Follow these instructions at home: Alcohol use Do not drink alcohol if: Your health care provider tells you not to drink. You are pregnant, may be pregnant, or are planning to become pregnant. If you drink alcohol: Limit how much you have to: 0-1 drink a day. Know how much alcohol is in your drink. In the [place] one drink equals one 12 oz bottle of beer (355 mL), one 5 oz glass of wine (148 mL), or one 1 oz glass of hard liquor (44 mL). Lifestyle Do not use any products that contain nicotine or tobacco. These products include cigarettes, chewing tobacco, and vaping devices, such as e-cigarettes. If you need help quitting, ask your health care provider. Do not use street drugs. Do not share needles. Ask your health care provider for help if you need support or information about quitting drugs. General instructions Schedule regular health, dental, and eye exams. Stay current with your vaccines. Tell your health care provider if: You often feel depressed. You have ever been abused or do not feel safe at home. Summary Adopting a healthy lifestyle and getting preventive care are important in promoting health and wellness. Follow your  health care provider's instructions about healthy diet, exercising, and getting tested or screened for diseases. Follow your health care provider's instructions on monitoring your cholesterol and blood pressure. This information is not intended to replace advice given to you by your health care provider. Make sure you discuss any questions you have with your health care provider. Document Revised: 04/12/2021 Document Reviewed: 04/12/2021 Elsevier Patient Education  2024 [employer].

## 2023-06-16 NOTE — Progress Notes (Signed)
Complete physical exam  Patient: Jean Campbell   DOB: 01/01/1991   32 y.o. Female  MRN: [id number]  Subjective:    Chief Complaint  Patient presents with   Annual Exam    Jean Campbell is a 32 y.o. female who presents today for a complete physical exam. She reports consuming a general diet. Exercise is limited by orthopedic condition(s): ankle injury. She generally feels well. She reports sleeping well. She does not have additional problems to discuss today.   She is drinking 4-6 beers a night. She is trying to cut back. She denies RUQ pain, nausea, vomiting, jaundice, confusion.   Seeing ortho for ankle injury. Right ankle in boot for another 4 weeks.   UTI  symptoms are improving.   Most recent fall risk assessment:    08/26/2021    8:37 AM  Fall Risk   Falls in the past year? 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 (Patient not taking: Reported on 11/15/2022)   albuterol (VENTOLIN HFA) 108 (90 Base) MCG/ACT inhaler Inhale 2 puffs into the lungs every 6 (six) hours as needed for wheezing or shortness of breath. (Patient  not taking: Reported on 06/14/2023)   aspirin EC 81 MG tablet Take 81 mg by mouth in the morning and at bedtime. Swallow whole.   cephALEXin (KEFLEX) 500 MG capsule Take 1 capsule (500 mg total) by mouth 2 (two) times daily.   cholecalciferol (VITAMIN D3) 25 MCG (1000 UNIT) tablet Take 1,000 Units by mouth daily.   meloxicam (MOBIC) 7.5 MG tablet Take 7.5 mg by mouth daily.   sucralfate (CARAFATE) 1 g tablet Take 1 tablet (1 g total) by mouth 4 (four) times daily. (Patient not taking: Reported on 06/14/2023)   No facility-administered medications prior to visit.    ROS Negative unless specially indicated above in HPI.      Objective:     BP 112/74   Pulse 76   Temp 97.6 F (36.4 C) (Oral)   Resp 20   Ht 5\' 3"  (1.6 m)   Wt 183 lb 4 oz (83.1 kg)   SpO2 97%   BMI 32.46 kg/m    Physical Exam Vitals and nursing note reviewed.  Constitutional:      General: She is not in acute distress.    Appearance: Normal appearance. She is not  ill-appearing.  HENT:     Head: Normocephalic.     Right Ear: Tympanic membrane, ear canal and external ear normal.     Left Ear: Tympanic membrane, ear canal and external ear normal.     Nose: Nose normal.     Mouth/Throat:     Mouth: Mucous membranes are dry.     Pharynx: Oropharynx is clear.  Eyes:     Extraocular Movements: Extraocular movements intact.     Conjunctiva/sclera: Conjunctivae normal.     Pupils: Pupils are equal, round, and reactive to light.  Neck:     Thyroid: No thyroid mass, thyromegaly or thyroid tenderness.  Cardiovascular:     Rate and Rhythm: Normal rate and regular rhythm.     Pulses: Normal pulses.     Heart sounds: Normal heart sounds. No murmur heard.    No friction rub. No gallop.  Pulmonary:     Effort: Pulmonary effort is normal.     Breath sounds: Normal breath sounds.  Abdominal:     General: Bowel sounds are normal. There is no distension.     Palpations: Abdomen is soft. There is hepatomegaly. There is  no fluid wave or mass.     Tenderness: There is no abdominal tenderness. There is no right CVA tenderness, left CVA tenderness or guarding. Negative signs include Murphy's sign.  Musculoskeletal:        General: No swelling.     Cervical back: Normal range of motion. No rigidity or tenderness.     Comments: Right foot in walking boot.   Skin:    General: Skin is warm and dry.     Capillary Refill: Capillary refill takes less than 2 seconds.     Findings: No lesion or rash.  Neurological:     General: No focal deficit present.     Mental Status: She is alert and oriented to person, place, and time.     Cranial Nerves: No cranial nerve deficit.     Motor: No weakness.     Gait: Gait normal.  Psychiatric:        Mood and Affect: Mood normal.        Behavior: Behavior normal.        Thought Content: Thought content normal.        Judgment: Judgment normal.      No results found for any visits on 06/16/23.     Assessment & Plan:    Routine Health Maintenance and Physical Exam  Jean Campbell was seen today for annual exam.  Diagnoses and all orders for this visit:  Routine general medical examination at a health care facility  Screening for endocrine, metabolic and immunity disorder -     Anemia Profile B -     CMP14+EGFR -     TSH  Encounter for screening for lipid disorder -     Lipid panel  Screening for STDs (sexually transmitted diseases) -     HepB+HepC+HIV Panel -     RPR  History of anemia -     Anemia Profile B  Alcohol abuse Discussed elevated LFTs on last labs in ER. Will repeat today. Discussed RUQ [place] pending labs. Discussed weaning alcohol and avoiding tylenol. Information given for rehab locations.  -     Anemia Profile B -     CMP14+EGFR -     Vitamin B1 -     Magnesium  Elevated LFTs -     CMP14+EGFR  Immunization History  Administered  Date(s) Administered   Hpv-Unspecified 03/20/2014   Influenza-Unspecified 08/31/2015   Rabies, IM 11/08/2019    Tdap 03/20/2014, 11/08/2019    Health Maintenance  Topic Date Due   HPV VACCINES (2 - 3-dose series) 04/17/2014   COVID-19 Vaccine (1 - 2023-24 season) Never done   INFLUENZA VACCINE  07/06/2023   PAP SMEAR-Modifier  01/16/2024   DTaP/Tdap/Td (3 - Td or Tdap) 11/07/2029   Hepatitis C Screening  Completed   HIV Screening  Completed    Discussed health benefits of physical activity, and encouraged her to engage in regular exercise appropriate for her age and condition.  Problem List Items Addressed This Visit       Other   Alcohol abuse   Relevant Orders   Anemia Profile B   CMP14+EGFR   Vitamin B1   Magnesium   Elevated LFTs   Relevant Orders   CMP14+EGFR   Other Visit Diagnoses     Routine general medical examination at a health care facility    -  Primary   Screening for endocrine, metabolic and immunity disorder       Relevant Orders   Anemia Profile B   CMP14+EGFR   TSH   Encounter for screening for lipid disorder       Relevant Orders   Lipid panel   Screening for STDs (sexually transmitted diseases)       Relevant Orders   HepB+HepC+HIV Panel   RPR   History of anemia       Relevant Orders   Anemia Profile B      Return in 1 year (on 06/15/2024). Sooner for new or worsening symptoms.   The patient indicates understanding of these issues and agrees with the plan.     Jean Earing, FNP

## 2023-06-17 LAB — CT/GC NAA, PHARYNGEAL
C TRACH RRNA NPH QL PCR: NEGATIVE
N GONORRHOEA RRNA NPH QL PCR: NEGATIVE

## 2023-06-17 LAB — URINE CULTURE

## 2023-06-19 ENCOUNTER — Other Ambulatory Visit: Payer: Self-pay | Admitting: Family Medicine

## 2023-06-19 DIAGNOSIS — R768 Other specified abnormal immunological findings in serum: Secondary | ICD-10-CM

## 2023-06-19 DIAGNOSIS — F1010 Alcohol abuse, uncomplicated: Secondary | ICD-10-CM

## 2023-06-19 DIAGNOSIS — R7989 Other specified abnormal findings of blood chemistry: Secondary | ICD-10-CM

## 2023-06-19 DIAGNOSIS — B3731 Acute candidiasis of vulva and vagina: Secondary | ICD-10-CM

## 2023-06-19 MED ORDER — FLUCONAZOLE 150 MG PO TABS: 150.0000 mg | ORAL_TABLET | ORAL | 0 refills | Status: DC | PRN

## 2023-06-20 ENCOUNTER — Other Ambulatory Visit: Payer: 59

## 2023-06-20 DIAGNOSIS — R768 Other specified abnormal immunological findings in serum: Secondary | ICD-10-CM | POA: Diagnosis not present

## 2023-06-20 LAB — ANEMIA PROFILE B
Basophils Absolute: 0 10*3/uL (ref 0.0–0.2)
Basos: 1 %
EOS (ABSOLUTE): 0.1 10*3/uL (ref 0.0–0.4)
Eos: 1 %
Ferritin: 265 ng/mL — ABNORMAL HIGH (ref 15–150)
Folate: 12 ng/mL (ref >3.0)
Hematocrit: 41.6 % (ref 34.0–46.6)
Hemoglobin: 14.2 g/dL (ref 11.1–15.9)
Immature Grans (Abs): 0 10*3/uL (ref 0.0–0.1)
Immature Granulocytes: 0 %
Iron Saturation: 17 % (ref 15–55)
Iron: 62 ug/dL (ref 27–159)
Lymphocytes Absolute: 2 10*3/uL (ref 0.7–3.1)
Lymphs: 25 %
MCH: 32.1 pg (ref 26.6–33.0)
MCHC: 34.1 g/dL (ref 31.5–35.7)
MCV: 94 fL (ref 79–97)
Monocytes Absolute: 0.6 10*3/uL (ref 0.1–0.9)
Monocytes: 7 %
Neutrophils Absolute: 5.2 10*3/uL (ref 1.4–7.0)
Neutrophils: 66 %
Platelets: 231 10*3/uL (ref 150–450)
RBC: 4.42 x10E6/uL (ref 3.77–5.28)
RDW: 12 % (ref 11.7–15.4)
Retic Ct Pct: 2.6 % (ref 0.6–2.6)
Total Iron Binding Capacity: 372 ug/dL (ref 250–450)
UIBC: 310 ug/dL (ref 131–425)
Vitamin B-12: 686 pg/mL (ref 232–1245)
WBC: 7.9 10*3/uL (ref 3.4–10.8)

## 2023-06-20 LAB — CMP14+EGFR
ALT: 185 IU/L — ABNORMAL HIGH (ref 0–32)
AST: 89 IU/L — ABNORMAL HIGH (ref 0–40)
Albumin: 4.5 g/dL (ref 3.9–4.9)
Alkaline Phosphatase: 91 IU/L (ref 44–121)
BUN/Creatinine Ratio: 15 (ref 9–23)
BUN: 12 mg/dL (ref 6–20)
Bilirubin Total: 0.3 mg/dL (ref 0.0–1.2)
CO2: 21 mmol/L (ref 20–29)
Calcium: 9.4 mg/dL (ref 8.7–10.2)
Chloride: 102 mmol/L (ref 96–106)
Creatinine, Ser: 0.82 mg/dL (ref 0.57–1.00)
Globulin, Total: 2.6 g/dL (ref 1.5–4.5)
Glucose: 90 mg/dL (ref 70–99)
Potassium: 4.8 mmol/L (ref 3.5–5.2)
Sodium: 136 mmol/L (ref 134–144)
Total Protein: 7.1 g/dL (ref 6.0–8.5)
eGFR: 96 mL/min/{1.73_m2} (ref >59)

## 2023-06-20 LAB — HEPB+HEPC+HIV PANEL
HIV Screen 4th Generation wRfx: NONREACTIVE
Hep B C IgM: NEGATIVE
Hep B Core Total Ab: NEGATIVE
Hep B E Ab: NONREACTIVE
Hep B E Ag: NEGATIVE
Hep B Surface Ab, Qual: REACTIVE
Hep C Virus Ab: REACTIVE — AB
Hepatitis B Surface Ag: NEGATIVE

## 2023-06-20 LAB — VITAMIN B1: Thiamine: 136.7 nmol/L (ref 66.5–200.0)

## 2023-06-20 LAB — RPR: RPR Ser Ql: NONREACTIVE

## 2023-06-20 LAB — LIPID PANEL
Chol/HDL Ratio: 3.6 ratio (ref 0.0–4.4)
Cholesterol, Total: 203 mg/dL — ABNORMAL HIGH (ref 100–199)
HDL: 56 mg/dL (ref >39)
LDL Chol Calc (NIH): 123 mg/dL — ABNORMAL HIGH (ref 0–99)
Triglycerides: 133 mg/dL (ref 0–149)
VLDL Cholesterol Cal: 24 mg/dL (ref 5–40)

## 2023-06-20 LAB — TSH: TSH: 2.47 u[IU]/mL (ref 0.450–4.500)

## 2023-06-20 LAB — MAGNESIUM: Magnesium: 2 mg/dL (ref 1.6–2.3)

## 2023-06-21 LAB — HCV RNA QUANT
HCV log10: 5.916 log10 IU/mL
Hepatitis C Quantitation: 824000 IU/mL

## 2023-06-22 ENCOUNTER — Other Ambulatory Visit: Payer: Self-pay | Admitting: Family Medicine

## 2023-06-22 ENCOUNTER — Other Ambulatory Visit: Payer: Self-pay

## 2023-06-22 DIAGNOSIS — R7989 Other specified abnormal findings of blood chemistry: Secondary | ICD-10-CM

## 2023-06-22 DIAGNOSIS — N644 Mastodynia: Secondary | ICD-10-CM

## 2023-06-22 DIAGNOSIS — B1920 Unspecified viral hepatitis C without hepatic coma: Secondary | ICD-10-CM

## 2023-06-29 ENCOUNTER — Ambulatory Visit (HOSPITAL_COMMUNITY)
Admission: RE | Admit: 2023-06-29 | Discharge: 2023-06-29 | Disposition: A | Discharge status: Home / Self Care | Payer: 59 | Source: Ambulatory Visit | Attending: Family Medicine | Admitting: Family Medicine

## 2023-06-29 DIAGNOSIS — F1010 Alcohol abuse, uncomplicated: Secondary | ICD-10-CM | POA: Diagnosis not present

## 2023-06-29 DIAGNOSIS — R7989 Other specified abnormal findings of blood chemistry: Secondary | ICD-10-CM | POA: Insufficient documentation

## 2023-07-05 ENCOUNTER — Telehealth: Payer: Self-pay | Admitting: Family Medicine

## 2023-07-05 NOTE — Telephone Encounter (Signed)
Pt called requesting to speak with nurse to discuss her lab and [place] results.

## 2023-07-06 ENCOUNTER — Other Ambulatory Visit: Payer: Self-pay | Admitting: Family Medicine

## 2023-07-06 ENCOUNTER — Ambulatory Visit
Admission: RE | Admit: 2023-07-06 | Discharge: 2023-07-06 | Disposition: A | Discharge status: Home / Self Care | Payer: 59 | Source: Ambulatory Visit | Attending: Family Medicine | Admitting: Family Medicine

## 2023-07-06 DIAGNOSIS — N6002 Solitary cyst of left breast: Secondary | ICD-10-CM | POA: Diagnosis not present

## 2023-07-06 DIAGNOSIS — N644 Mastodynia: Secondary | ICD-10-CM

## 2023-07-06 DIAGNOSIS — N6011 Diffuse cystic mastopathy of right breast: Secondary | ICD-10-CM | POA: Diagnosis not present

## 2023-07-06 DIAGNOSIS — R928 Other abnormal and inconclusive findings on diagnostic imaging of breast: Secondary | ICD-10-CM | POA: Diagnosis not present

## 2023-07-13 DIAGNOSIS — S93601A Unspecified sprain of right foot, initial encounter: Secondary | ICD-10-CM | POA: Diagnosis not present

## 2023-07-13 DIAGNOSIS — S92001A Unspecified fracture of right calcaneus, initial encounter for closed fracture: Secondary | ICD-10-CM | POA: Diagnosis not present

## 2023-07-13 DIAGNOSIS — S93602A Unspecified sprain of left foot, initial encounter: Secondary | ICD-10-CM | POA: Diagnosis not present

## 2023-07-13 DIAGNOSIS — S9032XA Contusion of left foot, initial encounter: Secondary | ICD-10-CM | POA: Diagnosis not present

## 2023-07-25 ENCOUNTER — Telehealth: Payer: Self-pay | Admitting: Family Medicine

## 2023-07-25 NOTE — Telephone Encounter (Signed)
I spoke to pt and advised we had referred her to GI at [facility] and they have called her to schedule. Pt will call them back to get set up.

## 2023-07-26 ENCOUNTER — Telehealth: Payer: Self-pay | Admitting: Gastroenterology

## 2023-07-26 NOTE — Telephone Encounter (Signed)
We do not treat hepatitis C Has to be referred to ID for that RG

## 2023-07-26 NOTE — Telephone Encounter (Signed)
Multiple calls open regarding this, will close this encounter.

## 2023-07-26 NOTE — Telephone Encounter (Signed)
Hi Dr. Chales Abrahams,    Supervising Provider 07/26/2023 PM    We received a referral for patient for Hepatitis C and Elevated LFTs. Patient was seen in 2022 with [facility]. Patient requesting a transfer of care due to primary care provider requesting patient be seen here due to symptoms. Believes patient will have better care with [facility].  Patient last record is in [facility] for you to review and advise on scheduling.    Thank you.

## 2023-08-03 NOTE — Telephone Encounter (Signed)
Called patient to advise. Left voicemail.

## 2023-08-04 ENCOUNTER — Telehealth: Payer: Self-pay | Admitting: Family Medicine

## 2023-08-04 DIAGNOSIS — B1920 Unspecified viral hepatitis C without hepatic coma: Secondary | ICD-10-CM

## 2023-08-04 NOTE — Telephone Encounter (Signed)
Toni Amend can you check on this? I thought [facility] treated hep C?

## 2023-08-08 NOTE — Telephone Encounter (Signed)
Per Referral Notes - Their Office has reached out x2 to get Patient scheduled for an Appt. She may call their Office back at [phone].

## 2023-08-10 NOTE — Telephone Encounter (Signed)
Pt called stating that she was told by GI specialist at [facility] said she needs to see an Infecious disease specialist asap.

## 2023-08-10 NOTE — Telephone Encounter (Signed)
ID referral placed

## 2023-08-10 NOTE — Telephone Encounter (Signed)
Patient returning call. Advised of recommendations.

## 2023-08-10 NOTE — Telephone Encounter (Signed)
Please review Patient's request.

## 2023-08-10 NOTE — Telephone Encounter (Signed)
Patient aware and verbalizes understanding. 

## 2023-08-18 ENCOUNTER — Telehealth: Payer: Self-pay | Admitting: Pharmacy Technician

## 2023-08-18 ENCOUNTER — Other Ambulatory Visit (HOSPITAL_COMMUNITY): Payer: Self-pay

## 2023-08-18 NOTE — Telephone Encounter (Signed)
[facility] Patient Advocate Encounter  Insurance verification completed.    The patient is insured through [facility]. Patient has [employer], may use a copay card, and/or apply for patient assistance if available.    Ran test claim for Mavyret  Medication will need a PA. We will continue to follow to see if copay assistance is needed.  This test claim was processed through [facility]- copay amounts may vary at other pharmacies due to pharmacy/plan contracts, or as the patient moves through the different stages of their insurance plan.

## 2023-08-22 ENCOUNTER — Ambulatory Visit (INDEPENDENT_AMBULATORY_CARE_PROVIDER_SITE_OTHER): Payer: 59 | Admitting: Infectious Diseases

## 2023-08-22 ENCOUNTER — Encounter: Payer: Self-pay | Admitting: Infectious Diseases

## 2023-08-22 ENCOUNTER — Other Ambulatory Visit: Payer: Self-pay

## 2023-08-22 VITALS — BP 107/74 | HR 65 | Temp 98.7°F | Resp 16 | Ht 63.25 in | Wt 185.8 lb

## 2023-08-22 DIAGNOSIS — B182 Chronic viral hepatitis C: Secondary | ICD-10-CM | POA: Insufficient documentation

## 2023-08-22 DIAGNOSIS — R7989 Other specified abnormal findings of blood chemistry: Secondary | ICD-10-CM

## 2023-08-22 NOTE — Assessment & Plan Note (Addendum)
New Patient with Chronic Hepatitis C genotype unknown, treatment naive. Fibrosis risk score very low with FIB4 0.96 (F0). Mode of transmission remote h/o injection drug use.   I discussed with the patient the lab findings that confirm chronic hepatitis C as well as the natural history and progression of disease including about 30% of people who develop cirrhosis of the liver if left untreated and once cirrhosis is established there is a 2-7% risk per year of liver cancer and liver failure.  I discussed the importance of treatment and benefits in reducing the risk, even if significant liver fibrosis exists. I also discussed risk for re-infection following treatment should he not continue to modify risk factors.   Patient counseled extensively on limiting acetaminophen to no more than 2 grams daily, avoidance of alcohol. Transmission discussed with patient including sexual transmission, sharing razors and toothbrush.  Will need referral to gastroenterology if concern for cirrhosis Will prescribe appropriate medication based on genotype and coverage  Hepatitis B titers to be drawn today with appropriate vaccinations as needed  Further work up to include liver staging through non-invasive serum analysis with APRI and FIB4 scores and Liver Fibrosis panel; U/S to follow if discordant or concerning results.  Will call Taydem A. Noland back once all results are in and counsel on medication over the phone. She will return 4 weeks after starting to meet with pharmacy team and check RNA at that time.   Planning mavyret course x 8 weeks. HIV (-) recently. HBsAg (-) recently. She uses OTC acid reducers occasionally but can stay off them during treatment course.

## 2023-08-22 NOTE — Patient Instructions (Signed)
Nice to meet you today!    We need to get a little more information about your hepatitis c infection before we start your treatment. I anticipate that we can get you started in a few weeks after we submit approval to your insurance to ensure payment. We may need to place referral for an ultrasound and/or gastroenterology if your blood work indicates more damage to the liver than expected.     ABOUT HEPATITIS C VIRUS:  Chronic Hepatitis C is the most common blood-borne infection in the [place], affecting approximately 3 million people.  It is the leading cause of cirrhosis, liver cancer, and end stage liver disease requiring transplantation when this infection goes untreated for many years  The majority of people who are infected are unaware because there are not many early symptoms that are specific to this and often go undiagnosed until a specific blood test is drawn.   The hepatitis c virus is passed primarily through direct exposure of contaminated blood or body fluids. It is most efficiently transmitted through repeated exposure to infected blood.  Risk for sexual transmission is very low but is possible if there is high frequency of unprotected sexual activity with known hepatitis c partner or multiple partners of known status.  Over time, approximately 60-70% of people can develop some degree of liver disease. Cirrhosis occurs in 10-20% of those with chronic infection. 1-5% will get liver cancer, which has a very high rate of death.   Approximately 15-25% clear the infection without medication (usually in the first 6 months of becoming exposed to virus)  Newer medications provide over 95% cure rate when taken as prescribed    IN GENERAL ABOUT DIET  Persons living with chronic hepatitis c infection should eat a diet to maintain a healthy weight and avoid nutritional deficiencies.   Completely avoiding alcohol is the best decision for your liver health. If unable to do so  please limit alcohol to as little as possible to less than 1 standard drink a day - this is very irritating to your liver.  Limit tylenol use to less than 2,000 mg daily (two extra strength tablets only twice a day)  If you have cirrhosis of the liver please take no more than 1,000 mg tylenol a day  Patients with cirrhosis should not have protein restriction; we recommend a protein intake of approximately 1.2-1.5 g/kg/day.   For patients with cirrhosis and hepatic encephalopathy, the American Association for the Study of Liver Diseases (AASLD) recommended protein intake is 1.2-1.5 g/kg/day.  If you experience ascites (fluid accumulation in the abdomen associated with severe liver damage / cirrhosis) please limit sodium intake to < 2000 mg a day    UNTIL YOU HAVE BEEN TREATED AND CURED:  Use condoms with all sexual encounters or practice abstinence to avoid sexual transmission   No sharing of razors, toothbrushes, nail clippers or anything that could potentially have blood on it.   If you cut yourself please clean and cover any wounds or open sores to others do not come into contact with your blood.   If blood spills onto item/surface please clean with 1:10 bleach solution and allow to dry, EVEN if it is dried blood.    GENERAL HELPFUL HINTS ON HCV THERAPY:  1. Stay well-hydrated.  2. Notify the [facility] of any changes in your other over-the-counter/herbal or prescription medications.  3. If you miss a dose of your medication, take the missed dose as soon as you remember. Return  to your regular time/dose schedule the next day.   4.  Do not stop taking your medications without first talking with your healthcare provider.  5.  You will see our pharmacist-specialist within the first 2 weeks of starting your medication to monitor for any possible side effects.  6.  You will have blood work once during treatment 4 weeks after your first pill. Again soon after treatment is completed  and one final lab 3 months after your last pill to ensure cure!   TIPS TO BE SUCCESSFUL WITH DAILY MEDICATION USE:  1. Set a reminder on your phone  2. Try filling out a pill box for the week - pick a day and put one pill for every day during the week so you know right away if you missed a pill.   3. Have a trusted family member ask you about your medications.   4. Smartphone app    Medication we would like to use for you will be :  Mavyret Instructions:  Take Mavyret, three tablets (at the same time) daily with food. Please take ALL THREE PILLS AT ONCE. You should take it at approximately the same time every day. Treatment will be for 8 weeks. Do not miss a dose.    Do not run out of Mavyret! If you are down to one week of medication left and have not heard about your next shipment, please let us know as soon as possible. You will be given 28 days of treatment at a time and will receive one refill.   If you need to start a new medication, prescription from your doctor or over the counter medication, you need to contact us to make sure it does not interfere with Mavyret. There are several medications that can interfere with Mavyret and can make you sick or make the medication not work.  If you need to take a medication for acid reflux, you can take omeprazole 20mg  daily.     Tylenol (acetominophen) and Advil (ibuprofen) are safe to take with Harvoni if needed for headache, fever, pain.   IF YOU ARE ON BIRTH CONTROL PILLS, YOU RECEIVE A SHOT FOR BIRTH CONTROL, OR YOU HAVE AN IUD, please notify your provider to make sure it is safe with Mavyret.   DO NOT stop Mavyret unless instructed to by your provider. If you are hospitalized while taking this medication please bring it with you to the hospital to avoid interruption of therapy. Every pill is important!  The most common side effects associated with Mavyret include:  Fatigue Headache Nausea Diarrhea Insomnia

## 2023-08-22 NOTE — Assessment & Plan Note (Signed)
Repeat today now that she has had 50m off ETOH.  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

## 2023-08-22 NOTE — Progress Notes (Signed)
Patient Name: Jean Campbell  Date of Birth: 1991/02/06  MRN: [id number]  PCP: Gabriel Earing, FNP  Referring Provider: Gabriel Earing, FNP, Ph#: [phone]   Patient Active Problem List   Diagnosis Date Noted   Chronic hepatitis C without hepatic coma (HCC) 08/22/2023   Elevated LFTs 06/16/2023   Depression, major, single episode, severe (HCC) 09/02/2021   Cough 07/05/2021   Acute pharyngitis 05/10/2021   IBS (irritable bowel syndrome) 03/29/2021   Alcohol abuse 03/29/2021   GERD (gastroesophageal reflux disease) 03/29/2021   Current smoker 02/28/2017   Depression 07/11/2016   GAD (generalized anxiety disorder) 07/11/2016    CC:  New patient - initial evaluation and management of chronic hepatitis C infection.    HPI/ROS:  Jean Campbell is a 32 y.o. female with h/o chronic hepatitis c infection. She has a h/o injection drug use in the past (5 years ago) - she is in sustained remission for SUD. Previously heavy alcohol consumption up until 2 months ago - now only rare use and limits to 1 standard drink.   Liver ultrasound completed recently with hepatic steatosis.  Works as Media planner in ICU.   Patient does not have documented immunity to Hepatitis B.     Review of Systems  Constitutional:  Negative for appetite change, fatigue, fever and unexpected weight change.  Respiratory:  Negative for shortness of breath.   Cardiovascular:  Negative for chest pain and leg swelling.  Gastrointestinal:  Negative for abdominal pain, blood in stool, nausea and vomiting.  Genitourinary:  Negative for difficulty urinating and hematuria.  Musculoskeletal:  Negative for arthralgias.  Skin:  Negative for color change.  Neurological:  Negative for dizziness, tremors and headaches.  Hematological:  Negative for adenopathy.  Psychiatric/Behavioral:  Negative for confusion.     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 Authorizing Provider  acetaminophen (TYLENOL) 500 MG tablet Take 1 tablet (500 mg total) by mouth every 6 (six) hours as needed. Patient not taking: Reported on 11/15/2022 05/10/21   Daryll Drown, NP  albuterol (VENTOLIN HFA) 108 (90 Base) MCG/ACT inhaler Inhale 2 puffs into the lungs every 6 (six) hours as needed for wheezing or shortness of breath. Patient not taking: Reported on 06/14/2023 11/15/22   Sonny Masters, FNP  aspirin EC 81 MG tablet Take 81 mg by mouth in the morning and at bedtime. Swallow whole.    [provider]  cephALEXin (KEFLEX) 500 MG capsule Take 1 capsule (500 mg total) by mouth 2 (two) times daily. 06/15/23   Gabriel Earing, FNP  cholecalciferol (VITAMIN D3) 25 MCG (1000 UNIT) tablet Take 1,000 Units by mouth daily.    [provider]  fluconazole (DIFLUCAN) 150 MG tablet Take 1 tablet (150 mg total) by mouth every three (3) days as  needed. 06/19/23   Gabriel Earing, FNP  meloxicam (MOBIC) 7.5 MG tablet Take 7.5 mg by mouth daily. 06/01/23   [provider]  sucralfate (CARAFATE) 1 g tablet Take 1 tablet (1 g total) by mouth 4 (four) times daily. Patient not taking: Reported on 06/14/2023 03/13/23   Lorre Nick, MD    Allergies  Allergen Reactions   Tilactase Nausea And Vomiting    Other reaction(s): GI Upset (intolerance)   Latex Rash   Sulfa Antibiotics Rash    Childhood allergy     Social History   Tobacco Use   Smoking status: Former    Current packs/day: 0.00    Average packs/day:  0.3 packs/day for 10.3 years (2.6 ttl pk-yrs)    Types: Cigarettes    Start date: 04/02/2012    Quit date: 08/03/2022    Years since quitting: 1.0   Smokeless tobacco: Never  Vaping Use   Vaping status: Never Used  Substance Use Topics   Alcohol use: Yes    Alcohol/week: 3.0 standard drinks of alcohol    Types: 3 Cans of beer per week    Comment: occasionally on weekends   Drug use: Yes    Types: Marijuana    Comment: history of meth use-last used 3 years ago    Family History  Problem Relation Age of Onset   Hyperlipidemia Mother    Anxiety disorder Mother    Drug abuse Father    ADD / ADHD Sister    Anxiety disorder Sister    Drug abuse Sister    Arthritis Maternal Grandmother        rheumatoid   Diabetes Maternal Grandmother    Arthritis Maternal Grandfather        rheumatoid   Arthritis Paternal Grandmother    Hyperlipidemia Paternal Grandmother    Hypertension Paternal Grandmother    Arthritis Paternal Grandfather    Heart disease Paternal Grandfather    Hyperlipidemia Paternal Grandfather    Hypertension Paternal Grandfather    Stroke Paternal Grandfather     Objective:   Vitals:   08/22/23 1408  BP: 107/74  Pulse: 65  Resp: 16  Temp: 98.7 F (37.1 C)  SpO2: 97%   Physical Exam Constitutional:      Appearance: Normal appearance. She is not ill-appearing.  HENT:     Mouth/Throat:     Mouth: Mucous membranes are moist.     Pharynx: Oropharynx is clear.  Eyes:     General: No scleral icterus. Cardiovascular:     Rate and Rhythm: Normal rate and regular rhythm.  Pulmonary:     Effort: Pulmonary effort is normal.     Comments: No shortness of breath detected in conversation.  Neurological:     Mental Status: She is oriented to person, place, and time.  Psychiatric:        Mood and Affect: Mood normal.        Behavior: Behavior normal.        Thought Content: Thought content normal.        Judgment: Judgment normal.     Laboratory: Genotype:  No results found for: "HCVGENOTYPE" HCV viral load: No results found for: "HCVQUANT" Lab Results  Component Value Date   WBC 7.9 06/16/2023   HGB 14.2 06/16/2023   HCT 41.6 06/16/2023   MCV 94 06/16/2023   PLT 231 06/16/2023    Lab Results  Component Value Date   CREATININE 0.82 06/16/2023   BUN 12 06/16/2023   NA  136 06/16/2023   K 4.8 06/16/2023   CL 102 06/16/2023   CO2 21 06/16/2023    Lab Results  Component Value Date   ALT 185 (H) 06/16/2023   AST 89 (H) 06/16/2023   ALKPHOS 91 06/16/2023    Lab Results  Component Value Date   BILITOT 0.3 06/16/2023   ALBUMIN 4.5 06/16/2023     Imaging:  U/S Liver: 06/2023  Increased echogenicity. No focal lesion. Portal vein is patent on color Doppler imaging with normal direction of blood flow towards the liver.  Assessment & Plan:   Problem List Items Addressed This Visit       Unprioritized   Elevated LFTs    Repeat today now that she has had 2m off ETOH.  Likely all d/t CHV - FIB-4 today 0.96 (F0)      Chronic hepatitis C without hepatic coma (HCC) - Primary    New Patient with Chronic Hepatitis C genotype unknown, treatment naive. Fibrosis risk score very low with FIB4 0.96 (F0). Mode of transmission remote h/o injection drug use.   I discussed with the patient the lab findings that confirm chronic hepatitis C as well as the natural history and progression of disease including about 30% of people who develop cirrhosis of the liver if left untreated and once cirrhosis is established there is a 2-7% risk per year of liver cancer and liver failure.  I discussed the importance of treatment and benefits in reducing the risk, even if significant liver fibrosis exists. I also discussed risk for re-infection following treatment should he not continue to modify risk factors.   Patient counseled extensively on limiting acetaminophen to no more than 2 grams daily, avoidance of alcohol. Transmission discussed with patient  including sexual transmission, sharing razors and toothbrush.  Will need referral to gastroenterology if concern for cirrhosis Will prescribe appropriate medication based on genotype and coverage  Hepatitis B titers to be drawn today with appropriate vaccinations as needed  Further work up to include liver staging through non-invasive serum analysis with APRI and FIB4 scores and Liver Fibrosis panel; U/S to follow if discordant or concerning results.  Will call Jean Campbell back once all results are in and counsel on medication over the phone. She will return 4 weeks after starting to meet with pharmacy team and check RNA at that time.   Planning mavyret course x 8 weeks. HIV (-) recently. HBsAg (-) recently. She uses OTC acid reducers occasionally but can stay off them during treatment course.         Relevant Orders   COMPLETE METABOLIC PANEL WITH GFR   CBC   Hepatitis C genotype   Liver Fibrosis, FibroTest-ActiTest   Hepatitis B surface antibody,qualitative    Rexene Alberts, MSN, NP-C [facility]  [email or web address] Pager: [phone] Office: [phone] [facility] Main Line: [phone]

## 2023-08-23 ENCOUNTER — Other Ambulatory Visit (HOSPITAL_COMMUNITY): Payer: Self-pay

## 2023-08-25 ENCOUNTER — Telehealth: Payer: Self-pay

## 2023-08-25 ENCOUNTER — Other Ambulatory Visit (HOSPITAL_COMMUNITY): Payer: Self-pay

## 2023-08-25 ENCOUNTER — Other Ambulatory Visit: Payer: Self-pay | Admitting: Infectious Diseases

## 2023-08-25 MED ORDER — SOFOSBUVIR-VELPATASVIR 400-100 MG PO TABS
1.0000 | ORAL_TABLET | Freq: Every day | ORAL | 2 refills | Status: DC
Start: 2023-08-25 — End: 2023-12-14
  Filled 2023-08-25 – 2023-08-31 (×2): qty 28, 28d supply, fill #0
  Filled 2023-09-22: qty 28, 28d supply, fill #1
  Filled 2023-10-20: qty 28, 28d supply, fill #2

## 2023-08-25 NOTE — Telephone Encounter (Signed)
[facility] Patient Advocate Encounter  Prior Authorization for Dorita [employer]) has been approved.    PA# [id number] Effective dates: 08/25/23 through 11/17/23  Patients co-pay is $250.00.   Epclusa copay card makes copay $5.00       [facility] Clinic will continue to follow.  Clearance Coots, CPhT Specialty Pharmacy Patient [facility] for Infectious Disease Phone: [phone] Fax:  [phone]

## 2023-08-25 NOTE — Telephone Encounter (Addendum)
[facility] Patient Advocate Encounter   Received notification from [employer] that prior authorization for Jean Campbell is required.   PA submitted on 08/25/23  Key [id number] Status is pending    [facility] Clinic will continue to follow.   Clearance Coots, CPhT Specialty Pharmacy Patient [facility] for Infectious Disease Phone: [phone] Fax:  [phone]

## 2023-08-28 ENCOUNTER — Other Ambulatory Visit: Payer: Self-pay

## 2023-08-29 ENCOUNTER — Other Ambulatory Visit: Payer: Self-pay

## 2023-08-29 ENCOUNTER — Other Ambulatory Visit (HOSPITAL_COMMUNITY): Payer: Self-pay

## 2023-08-29 NOTE — Progress Notes (Signed)
Specialty Pharmacy Initial Fill Coordination Note  Jean Campbell is a 32 y.o. female contacted today regarding refills of specialty medication(s) Sofosbuvir-Velpatasvir .  Patient requested Courier to Provider Office  on 08/30/23  to verified address [facility] [address]   Medication will be filled on 08/30/23.   Patient is aware of $5.00 copayment.

## 2023-08-30 LAB — COMPLETE METABOLIC PANEL WITH GFR
AG Ratio: 1.4 (calc) (ref 1.0–2.5)
ALT: 132 U/L — ABNORMAL HIGH (ref 6–29)
AST: 65 U/L — ABNORMAL HIGH (ref 10–30)
Albumin: 4.6 g/dL (ref 3.6–5.1)
Alkaline phosphatase (APISO): 76 U/L (ref 31–125)
BUN: 8 mg/dL (ref 7–25)
CO2: 23 mmol/L (ref 20–32)
Calcium: 9.2 mg/dL (ref 8.6–10.2)
Chloride: 103 mmol/L (ref 98–110)
Creat: 0.67 mg/dL (ref 0.50–0.97)
Globulin: 3.2 g/dL (calc) (ref 1.9–3.7)
Glucose, Bld: 87 mg/dL (ref 65–99)
Potassium: 4.5 mmol/L (ref 3.5–5.3)
Sodium: 137 mmol/L (ref 135–146)
Total Bilirubin: 0.4 mg/dL (ref 0.2–1.2)
Total Protein: 7.8 g/dL (ref 6.1–8.1)
eGFR: 118 mL/min/{1.73_m2} (ref >=60)

## 2023-08-30 LAB — LIVER FIBROSIS, FIBROTEST-ACTITEST
ALT: 136 U/L — ABNORMAL HIGH (ref 6–29)
Alpha-2-Macroglobulin: 148 mg/dL (ref 106–279)
Apolipoprotein A1: 172 mg/dL (ref 101–198)
Bilirubin: 0.4 mg/dL (ref 0.2–1.2)
Fibrosis Score: 0.04
GGT: 19 U/L (ref 3–50)
Haptoglobin: 118 mg/dL (ref 43–212)
Necroinflammat ACT Score: 0.6
Reference ID: 5120385

## 2023-08-30 LAB — CBC
HCT: 43.3 % (ref 35.0–45.0)
Hemoglobin: 14.5 g/dL (ref 11.7–15.5)
MCH: 30.7 pg (ref 27.0–33.0)
MCHC: 33.5 g/dL (ref 32.0–36.0)
MCV: 91.7 fL (ref 80.0–100.0)
MPV: 10.7 fL (ref 7.5–12.5)
Platelets: 263 10*3/uL (ref 140–400)
RBC: 4.72 10*6/uL (ref 3.80–5.10)
RDW: 11.7 % (ref 11.0–15.0)
WBC: 8.1 10*3/uL (ref 3.8–10.8)

## 2023-08-30 LAB — HEPATITIS B SURFACE ANTIBODY,QUALITATIVE: Hep B S Ab: REACTIVE — AB

## 2023-08-30 LAB — HEPATITIS C GENOTYPE: HCV Genotype: 2

## 2023-08-31 ENCOUNTER — Other Ambulatory Visit (HOSPITAL_COMMUNITY): Payer: Self-pay

## 2023-08-31 ENCOUNTER — Other Ambulatory Visit: Payer: Self-pay

## 2023-09-04 ENCOUNTER — Telehealth: Payer: Self-pay

## 2023-09-04 NOTE — Telephone Encounter (Signed)
[facility] Patient Advocate Encounter  Patient's medications (EPCLUSA) have been couriered to [facility] from [facility] and will be picked up 09/04/23.  Kae Heller , CPhT Specialty Pharmacy Patient [facility] for Infectious Disease Phone: [phone] Fax:  [phone]

## 2023-09-05 ENCOUNTER — Other Ambulatory Visit: Payer: Self-pay | Admitting: Pharmacist

## 2023-09-05 NOTE — Progress Notes (Addendum)
Patient is approved to receive Epclusa x 12 weeks for chronic Hepatitis C infection. Counseled patient to take Epclusa daily with or without food. Encouraged patient not to miss any doses and explained how their chance of cure could go down with each dose missed. Counseled patient on what to do if dose is missed - if it is closer to the missed dose take immediately; if closer to next dose skip dose and take the next dose at the usual time. Counseled patient on common side effects such as headache, fatigue, and nausea and that these normally decrease with time. I reviewed patient medications and found no drug interactions. Discussed with patient that there are several drug interactions including acid suppressants. Instructed patient to call clinic if she wishes to start a new medication during course of therapy. Also advised patient to call if she experiences any side effects. Patient will follow-up with me in the pharmacy clinic on 10/29.  Margarite Gouge, PharmD, CPP, BCIDP, AAHIVP Clinical Pharmacist Practitioner Infectious Diseases Clinical Pharmacist [facility] for Infectious Disease

## 2023-09-05 NOTE — Progress Notes (Addendum)
Specialty Pharmacy Initiation Note   Jean Campbell is a 32 y.o. female who will be followed by the specialty pharmacy service for RxSp Hepatitis C    Review of administration, indication, effectiveness, safety, potential side effects, storage/disposable, and missed dose instructions occurred today for patient's specialty medication(s) Sofosbuvir-Velpatasvir     Patient did not have any additional questions or concerns.   Patient's therapy is appropriate to: Initiate    Goals Addressed             This Visit's Progress    Achieve virologic cure as evidenced by SVR       Patient is initiating therapy. Patient will be evaluated at upcoming provider appointment to assess progress

## 2023-09-12 ENCOUNTER — Encounter: Payer: Self-pay | Admitting: Nurse Practitioner

## 2023-09-12 ENCOUNTER — Ambulatory Visit (INDEPENDENT_AMBULATORY_CARE_PROVIDER_SITE_OTHER): Payer: 59 | Admitting: Nurse Practitioner

## 2023-09-12 VITALS — BP 110/69 | HR 78 | Temp 97.8°F | Resp 20 | Ht 63.0 in | Wt 187.0 lb

## 2023-09-12 DIAGNOSIS — L2089 Other atopic dermatitis: Secondary | ICD-10-CM | POA: Diagnosis not present

## 2023-09-12 MED ORDER — EUCRISA 2 % EX OINT
TOPICAL_OINTMENT | CUTANEOUS | 1 refills | Status: DC
Start: 2023-09-12 — End: 2023-11-16

## 2023-09-12 NOTE — Patient Instructions (Signed)
Atopic Dermatitis ?Atopic dermatitis is a skin disorder that causes inflammation of the skin. It is marked by a red rash and itchy, dry, scaly skin. It is the most common type of eczema. Eczema is a group of skin conditions that cause the skin to become rough and swollen. This condition is generally worse during the cooler winter months and often improves during the warm summer months. ?Atopic dermatitis usually starts showing signs in infancy and can last through adulthood. This condition cannot be passed from one person to another (is not contagious). Atopic dermatitis may not always be present, but when it is, it is called a flare-up. ?What are the causes? ?The exact cause of this condition is not known. Flare-ups may be triggered by: ?Coming in contact with something that you are sensitive or allergic to (allergen). ?Stress. ?Certain foods. ?Extremely hot or cold weather. ?Harsh chemicals and soaps. ?Dry air. ?Chlorine. ?What increases the risk? ?This condition is more likely to develop in people who have a personal or family history of: ?Eczema. ?Allergies. ?Asthma. ?Hay fever. ?What are the signs or symptoms? ?Symptoms of this condition include: ?Dry, scaly skin. ?Red, itchy rash. ?Itchiness, which can be severe. This may occur before the skin rash. This can make sleeping difficult. ?Skin thickening and cracking that can occur over time. ?How is this diagnosed? ?This condition is diagnosed based on: ?Your symptoms. ?Your medical history. ?A physical exam. ?How is this treated? ?There is no cure for this condition, but symptoms can usually be controlled. Treatment focuses on: ?Controlling the itchiness and scratching. You may be given medicines, such as antihistamines or steroid creams. ?Limiting exposure to allergens. ?Recognizing situations that cause stress and developing a plan to manage stress. ?If your atopic dermatitis does not get better with medicines, or if it is all over your body (widespread), a  treatment using a specific type of light (phototherapy) may be used. ?Follow these instructions at home: ?Skin care ? ?Keep your skin well moisturized. Doing this seals in moisture and helps to prevent dryness. ?Use unscented lotions that have petroleum in them. ?Avoid lotions that contain alcohol or water. They can dry the skin. ?Keep baths or showers short (less than 5 minutes) in warm water. Do not use hot water. ?Use mild, unscented cleansers for bathing. Avoid soap and bubble bath. ?Apply a moisturizer to your skin right after a bath or shower. ?Do not apply anything to your skin without checking with your health care provider. ?General instructions ?Take or apply over-the-counter and prescription medicines only as told by your health care provider. ?Dress in clothes made of cotton or cotton blends. Dress lightly because heat increases itchiness. ?When washing your clothes, rinse your clothes twice so all of the soap is removed. ?Avoid any triggers that can cause a flare-up. ?Keep your fingernails cut short. ?Avoid scratching. Scratching makes the rash and itchiness worse. A break in the skin from scratching could result in a skin infection (impetigo). ?Do not be around people who have cold sores or fever blisters. If you get the infection, it may cause your atopic dermatitis to worsen. ?Keep all follow-up visits. This is important. ?Contact a health care provider if: ?Your itchiness interferes with sleep. ?Your rash gets worse or is not better within one week of starting treatment. ?You have a fever. ?You have a rash flare-up after having contact with someone who has cold sores or fever blisters. ?Get help right away if: ?You develop pus or soft yellow scabs in the rash   area. ?Summary ?Atopic dermatitis causes a red rash and itchy, dry, scaly skin. ?Treatment focuses on controlling the itchiness and scratching, limiting exposure to things that you are sensitive or allergic to (allergens), recognizing  situations that cause stress, and developing a plan to manage stress. ?Keep your skin well moisturized. ?Keep baths or showers shorter than 5 minutes and use warm water. Do not use hot water. ?This information is not intended to replace advice given to you by your health care provider. Make sure you discuss any questions you have with your health care provider. ?Document Revised: 08/31/2020 Document Reviewed: 08/31/2020 ?Elsevier Patient Education ? 2023 Elsevier Inc. ? ?

## 2023-09-12 NOTE — Progress Notes (Signed)
   Subjective:    Patient ID: Jean Campbell, female    DOB: 1991-07-30, 32 y.o.   MRN: [id number]   Chief Complaint: Red itchy rash on face   HPI  Itchy ras on face. Started about 3 months ago. Is itchy. Slight burning. Is slowly spreading. Patient Active Problem List   Diagnosis Date Noted   Chronic hepatitis C without hepatic coma (HCC) 08/22/2023   Elevated LFTs 06/16/2023   Depression, major, single episode, severe (HCC) 09/02/2021   Cough 07/05/2021   Acute pharyngitis 05/10/2021   IBS (irritable bowel syndrome) 03/29/2021   Alcohol abuse 03/29/2021   GERD (gastroesophageal reflux disease) 03/29/2021   Current smoker 02/28/2017   Depression 07/11/2016   GAD (generalized anxiety disorder) 07/11/2016       Review of Systems  Constitutional:  Negative for diaphoresis.  Eyes:  Negative for pain.  Respiratory:  Negative for shortness of breath.   Cardiovascular:  Negative for chest pain, palpitations and leg swelling.  Gastrointestinal:  Negative for abdominal pain.  Endocrine: Negative for polydipsia.  Skin:  Negative for rash.  Neurological:  Negative for dizziness, weakness and headaches.  Hematological:  Does not bruise/bleed easily.  All other systems reviewed and are negative.      Objective:   Physical Exam Constitutional:      Appearance: Normal appearance. She is obese.  Cardiovascular:     Rate and Rhythm: Normal rate and regular rhythm.     Heart sounds: Normal heart sounds.  Pulmonary:     Effort: Pulmonary effort is normal.     Breath sounds: Normal breath sounds.  Skin:    General: Skin is warm.     Findings: Rash (left cheek) present.  Neurological:     General: No focal deficit present.     Mental Status: She is alert and oriented to person, place, and time.    BP 110/69   Pulse 78   Temp 97.8 F (36.6 C) (Temporal)   Resp 20   Ht 5\' 3"  (1.6 m)   Wt 187 lb (84.8 kg)   SpO2 97%   BMI 33.13 kg/m         Assessment & Plan:   Jean Campbell in today with chief complaint of Red itchy rash on face   1. Flexural atopic dermatitis Avoid rubbing or scratching Good handwashing RTO prn - Crisaborole (EUCRISA) 2 % OINT; Apply to affected area bid  Dispense: 100 g; Refill: 1    The above assessment and management plan was discussed with the patient. The patient verbalized understanding of and has agreed to the management plan. Patient is aware to call the clinic if symptoms persist or worsen. Patient is aware when to return to the clinic for a follow-up visit. Patient educated on when it is appropriate to go to the emergency department.   Mary-Margaret Daphine Deutscher, FNP

## 2023-09-13 ENCOUNTER — Ambulatory Visit: Payer: 59 | Admitting: Nurse Practitioner

## 2023-09-22 ENCOUNTER — Other Ambulatory Visit: Payer: Self-pay

## 2023-09-22 NOTE — Progress Notes (Signed)
Specialty Pharmacy Refill Coordination Note  Jean Campbell is a 32 y.o. female contacted today regarding refills of specialty medication(s) Sofosbuvir-Velpatasvir   Patient requested Delivery   Delivery date: 09/29/23   Verified address: [address] [place] [address]   Medication will be filled on 09/28/23.

## 2023-09-25 ENCOUNTER — Encounter: Payer: Self-pay | Admitting: Family

## 2023-09-25 ENCOUNTER — Ambulatory Visit (INDEPENDENT_AMBULATORY_CARE_PROVIDER_SITE_OTHER): Payer: 59

## 2023-09-25 VITALS — BP 104/73 | HR 68 | Temp 98.0°F | Resp 20 | Ht 63.0 in | Wt 191.0 lb

## 2023-09-25 DIAGNOSIS — L089 Local infection of the skin and subcutaneous tissue, unspecified: Secondary | ICD-10-CM

## 2023-09-25 MED ORDER — MUPIROCIN 2 % EX OINT: 1.0000 | TOPICAL_OINTMENT | Freq: Two times a day (BID) | CUTANEOUS | 0 refills | Status: DC

## 2023-09-25 MED ORDER — DOXYCYCLINE HYCLATE 100 MG PO TABS
100.0000 mg | ORAL_TABLET | Freq: Two times a day (BID) | ORAL | 0 refills | Status: DC
Start: 2023-09-25 — End: 2023-11-16

## 2023-09-25 NOTE — Patient Instructions (Signed)

## 2023-09-25 NOTE — Progress Notes (Signed)
Subjective:    Patient ID: Jean Campbell, female    DOB: [birth year], 32 y.o.   MRN: [id number]  Chief Complaint  Patient presents with   Rash   Pt presents to the office today with rash on around mouth, behind ears that started three months ago. She was seen on 09/12/23 and was given Saint Martin without any relief.   Reports it started as a pimple on her left cheek then spread in that area, then to her right cheek, and behind both ears. Reports mild itching.  Rash This is a new problem. The current episode started in the past 7 days. The problem has been gradually worsening since onset. The affected locations include the face. The rash is characterized by itchiness. Past treatments include anti-itch cream. The treatment provided mild relief.      Review of Systems  Skin:  Positive for rash.  All other systems reviewed and are negative.      Objective:   Physical Exam Vitals reviewed.  Constitutional:      General: She is not in acute distress.    Appearance: She is well-developed.  HENT:     Head: Normocephalic and atraumatic.  Eyes:     Pupils: Pupils are equal, round, and reactive to light.  Neck:     Thyroid: No thyromegaly.  Cardiovascular:     Rate and Rhythm: Normal rate and regular rhythm.     Heart sounds: Normal heart sounds. No murmur heard. Pulmonary:     Effort: Pulmonary effort is normal. No respiratory distress.     Breath sounds: Normal breath sounds. No wheezing.  Abdominal:     General: Bowel sounds are normal. There is no distension.     Palpations: Abdomen is soft.     Tenderness: There is no abdominal tenderness.  Musculoskeletal:        General: No tenderness. Normal range of motion.     Cervical back: Normal range of motion and neck supple.  Skin:    General: Skin is warm and dry.     Findings: Erythema and rash present.          Comments: Pustular rash on left cheek and a few on right cheek.   Neurological:     Mental Status: She is alert  and oriented to person, place, and time.     Cranial Nerves: No cranial nerve deficit.     Deep Tendon Reflexes: Reflexes are normal and symmetric.  Psychiatric:        Behavior: Behavior normal.        Thought Content: Thought content normal.        Judgment: Judgment normal.     BP 104/73   Pulse 68   Temp 98 F (36.7 C) (Oral)   Resp 20   Ht 5\' 3"  (1.6 m)   Wt 191 lb (86.6 kg)   SpO2 99%   BMI 33.83 kg/m        Assessment & Plan:  Jean Campbell comes in today with chief complaint of Rash   Diagnosis and orders addressed:  1. Skin infection Avoid scratching  Keep clean and dry Start doxycyline and bactroban  Follow up if symptoms worsen or do not improve  - doxycycline (VIBRA-TABS) 100 MG tablet; Take 1 tablet (100 mg total) by mouth 2 (two) times daily.  Dispense: 20 tablet; Refill: 0 - mupirocin ointment (BACTROBAN) 2 %; Apply 1 Application topically 2 (two) times daily.  Dispense: 22 g; Refill: 0  Jannifer Rodney, FNP

## 2023-09-26 ENCOUNTER — Telehealth: Payer: Self-pay

## 2023-09-26 NOTE — Telephone Encounter (Signed)
Yep - no DDI at all. Good to take doxycycline!

## 2023-09-26 NOTE — Telephone Encounter (Signed)
Patient called, she went to her PCP because her face has been breaking out over the last 3-4 months. She has started using the Saint Martin that was prescribed topically, but wanted to hold off on the doxycycline until she made sure there were no interactions with her Epclusa.   Sandie Ano, RN

## 2023-09-28 ENCOUNTER — Other Ambulatory Visit (HOSPITAL_COMMUNITY): Payer: Self-pay

## 2023-10-02 NOTE — Progress Notes (Unsigned)
HPI: Jean Campbell is a 32 y.o. female who presents to the [facility] pharmacy clinic for Hepatitis C 1 month follow-up.  Medication: Epclusa x12 weeks  Start Date: 09/05/23  Hepatitis C Genotype: 2  Fibrosis Score: FIB4 0.96 (F0)  Hepatitis C RNA: 06/20/23: 824,000  Patient Active Problem List   Diagnosis Date Noted   Chronic hepatitis C without hepatic coma (HCC) 08/22/2023   Elevated LFTs 06/16/2023   Depression, major, single episode, severe (HCC) 09/02/2021   Cough 07/05/2021   Acute pharyngitis 05/10/2021   IBS (irritable bowel syndrome) 03/29/2021   Alcohol abuse 03/29/2021   GERD (gastroesophageal reflux disease) 03/29/2021   Current smoker 02/28/2017   Depression 07/11/2016   GAD (generalized anxiety disorder) 07/11/2016    Patient's Medications  New Prescriptions   No medications on file  Previous Medications   CRISABOROLE (EUCRISA) 2 % OINT    Apply to affected area bid   DOXYCYCLINE (VIBRA-TABS) 100 MG TABLET    Take 1 tablet (100 mg total) by mouth 2 (two) times daily.   MUPIROCIN OINTMENT (BACTROBAN) 2 %    Apply 1 Application topically 2 (two) times daily.   SOFOSBUVIR-VELPATASVIR (EPCLUSA) 400-100 MG TABS    Take 1 tablet by mouth daily.  Modified Medications   No medications on file  Discontinued Medications   No medications on file    Labs: Hepatitis C Lab Results  Component Value Date   HCVGENOTYPE 2 08/22/2023   FIBROSTAGE F0 08/22/2023   Hepatitis B Lab Results  Component Value Date   HEPBSAB REACTIVE (A) 08/22/2023   HEPBSAG Negative 06/16/2023   HEPBCAB Negative 06/16/2023   Hepatitis A No results found for: "HAV" HIV Lab Results  Component Value Date   HIV Non Reactive 06/16/2023   HIV Non Reactive 01/15/2021   Lab Results  Component Value Date   CREATININE 0.67 08/22/2023   CREATININE 0.82 06/16/2023   CREATININE 0.75 03/13/2023   CREATININE 1.00 04/22/2022   CREATININE 0.77 08/22/2021   Lab Results  Component Value Date    AST 65 (H) 08/22/2023   AST 89 (H) 06/16/2023   AST 122 (H) 03/13/2023   ALT 132 (H) 08/22/2023   ALT 136 (H) 08/22/2023   ALT 185 (H) 06/16/2023    Assessment: Jean Campbell presents today for Hepatitis C treatment follow up. She started Epclusa x 12 weeks on 09/05/23. She reports no missed doses. She has minimal concerns for adverse effects. She reports mild fatigue, but this has not impacted her daily living or ability to take medication. She reports a reduction in alcohol intake, to about 2 drinks per week. Encouraged further reduction. We will check LFTs today given slight elevations prior.  Reinforced importance of adherence, as chance of cure could go down with each dose missed. Counseled patient to take Epclusa daily with or without food. Counseled patient on what to do if dose is missed - if it is closer to the missed dose take immediately; if closer to next dose skip dose and take the next dose at the usual time. Counseled patient on common side effects such as headache, fatigue, and nausea and that these normally decrease with time. I reviewed patient medications and found no drug interactions. Discussed with patient that there are several drug interactions including acid suppressants.   Immunizations: Will work to complete HPV and HAV vaccine series today. Patient reports that she has received her influenza vaccine, and politely declines the COVID vaccine today.  Plan: > Will obtain HCV  RNA, CMP today to check LFTs on therapy > Follow up 12/14/23 at 1 pm with Marchelle Folks > Administer HPV 2/3, HAV 1/2 today. Will need 3rd dose HPV in 12 weeks and HAV 2/2 in 6-12 months. > Call with any questions or concerns  Lora Paula, PharmD PGY-2 Infectious Diseases Pharmacy Resident [facility] 10/02/2023, 4:33 PM

## 2023-10-03 ENCOUNTER — Other Ambulatory Visit: Payer: Self-pay

## 2023-10-03 ENCOUNTER — Ambulatory Visit: Payer: 59 | Admitting: Pharmacist

## 2023-10-03 DIAGNOSIS — Z23 Encounter for immunization: Secondary | ICD-10-CM | POA: Diagnosis not present

## 2023-10-03 DIAGNOSIS — B182 Chronic viral hepatitis C: Secondary | ICD-10-CM

## 2023-10-07 LAB — COMPREHENSIVE METABOLIC PANEL
AG Ratio: 1.4 (calc) (ref 1.0–2.5)
ALT: 15 U/L (ref 6–29)
AST: 18 U/L (ref 10–30)
Albumin: 4.2 g/dL (ref 3.6–5.1)
Alkaline phosphatase (APISO): 69 U/L (ref 31–125)
BUN: 9 mg/dL (ref 7–25)
CO2: 25 mmol/L (ref 20–32)
Calcium: 9.4 mg/dL (ref 8.6–10.2)
Chloride: 104 mmol/L (ref 98–110)
Creat: 0.68 mg/dL (ref 0.50–0.97)
Globulin: 2.9 g/dL (ref 1.9–3.7)
Glucose, Bld: 72 mg/dL (ref 65–99)
Potassium: 4.7 mmol/L (ref 3.5–5.3)
Sodium: 137 mmol/L (ref 135–146)
Total Bilirubin: 0.4 mg/dL (ref 0.2–1.2)
Total Protein: 7.1 g/dL (ref 6.1–8.1)

## 2023-10-07 LAB — HEPATITIS C RNA QUANTITATIVE
HCV Quantitative Log: <1.18 {Log_IU}/mL — ABNORMAL HIGH
HCV RNA, PCR, QN: <15 [IU]/mL — ABNORMAL HIGH

## 2023-10-19 DIAGNOSIS — S93601A Unspecified sprain of right foot, initial encounter: Secondary | ICD-10-CM | POA: Diagnosis not present

## 2023-10-19 DIAGNOSIS — S9032XA Contusion of left foot, initial encounter: Secondary | ICD-10-CM | POA: Diagnosis not present

## 2023-10-19 DIAGNOSIS — S92001A Unspecified fracture of right calcaneus, initial encounter for closed fracture: Secondary | ICD-10-CM | POA: Diagnosis not present

## 2023-10-20 ENCOUNTER — Other Ambulatory Visit (HOSPITAL_COMMUNITY): Payer: Self-pay

## 2023-10-20 ENCOUNTER — Other Ambulatory Visit: Payer: Self-pay

## 2023-10-20 NOTE — Progress Notes (Signed)
Clinical Intervention Note  Clinical Intervention Notes: Pt asked if she could take a MVI along with Epclusa. Recommended to space out MVI administration with Epclusa. Pt plans to take MVI in the morning and Epclusa in the evening. No other follow up needed at this time.   Clinical Intervention Outcomes: Prevention of an adverse drug event   Jean Campbell Specialty Pharmacist

## 2023-10-20 NOTE — Progress Notes (Signed)
Specialty Pharmacy Refill Coordination Note  Jean Campbell is a 32 y.o. female contacted today regarding refills of specialty medication(s) Sofosbuvir-Velpatasvir   Patient requested Delivery   Delivery date: 10/24/23   Verified address: [address] [place] [address]   Medication will be filled on 10/23/23.

## 2023-10-23 ENCOUNTER — Other Ambulatory Visit (HOSPITAL_COMMUNITY): Payer: Self-pay

## 2023-11-08 ENCOUNTER — Other Ambulatory Visit: Payer: Self-pay

## 2023-11-08 NOTE — Progress Notes (Signed)
End Clinical Intervention from 11/15. Looks like it was created in error.

## 2023-11-16 ENCOUNTER — Ambulatory Visit (INDEPENDENT_AMBULATORY_CARE_PROVIDER_SITE_OTHER): Payer: 59 | Admitting: Family Medicine

## 2023-11-16 ENCOUNTER — Encounter: Payer: Self-pay | Admitting: Family Medicine

## 2023-11-16 VITALS — BP 108/70 | HR 69 | Temp 97.7°F | Ht 63.0 in | Wt 191.4 lb

## 2023-11-16 DIAGNOSIS — K219 Gastro-esophageal reflux disease without esophagitis: Secondary | ICD-10-CM

## 2023-11-16 DIAGNOSIS — B182 Chronic viral hepatitis C: Secondary | ICD-10-CM

## 2023-11-16 DIAGNOSIS — Z6833 Body mass index (BMI) 33.0-33.9, adult: Secondary | ICD-10-CM | POA: Diagnosis not present

## 2023-11-16 DIAGNOSIS — E782 Mixed hyperlipidemia: Secondary | ICD-10-CM

## 2023-11-16 DIAGNOSIS — E66811 Obesity, class 1: Secondary | ICD-10-CM | POA: Diagnosis not present

## 2023-11-16 DIAGNOSIS — E6609 Other obesity due to excess calories: Secondary | ICD-10-CM | POA: Diagnosis not present

## 2023-11-16 NOTE — Progress Notes (Signed)
Established Patient Office Visit  Subjective   Patient ID: Jean Campbell, female    DOB: [birth year]  Age: 32 y.o. MRN: [id number]  Chief Complaint  Patient presents with   Obesity    HPI Jean Campbell would like to discuss weight loss today. She has cut out alcohol. She has been going to the gym 2-3x a week now. She has been eating a well balanced diet. She is interested in medications to assist with weight loss. She is primarily interested in semaglutide.   Hx of umbical hernia. Had mesh repair around 2014-2015. She has started having some epigastric abdominal pain that is intermittent. Pain is sharp at times. Pain sometimes radiates to LUQ and RUQ. Denies nausea or vomiting. Pain is often after eating. Sometimes has heartburn and water brash. Isn't able to take anything for GERD currently due to treatment for Hep C. She would like to make sure that she isn't having a reoccurence of her hernia as she has been weight lifting.     ROS As per HPI.    Objective:     BP 108/70   Pulse 69   Temp 97.7 F (36.5 C) (Temporal)   Ht 5\' 3"  (1.6 m)   Wt 191 lb 6 oz (86.8 kg)   SpO2 98%   BMI 33.90 kg/m    Physical Exam Vitals and nursing note reviewed.  Constitutional:      General: She is not in acute distress.    Appearance: She is not ill-appearing, toxic-appearing or diaphoretic.  Eyes:     General: No scleral icterus. Pulmonary:     Effort: Pulmonary effort is normal. No respiratory distress.  Abdominal:     General: Bowel sounds are normal. There is no distension.     Palpations: Abdomen is soft.     Tenderness: There is no abdominal tenderness. There is no guarding or rebound.     Hernia: No hernia is present. There is no hernia in the umbilical area or ventral area.  Musculoskeletal:     Cervical back: Neck supple. No rigidity.     Right lower leg: No edema.     Left lower leg: No edema.  Skin:    General: Skin is warm and dry.     Coloration: Skin is not jaundiced.   Neurological:     General: No focal deficit present.     Mental Status: She is alert and oriented to person, place, and time.  Psychiatric:        Mood and Affect: Mood normal.        Behavior: Behavior normal.      No results found for any visits on 11/16/23.    The ASCVD Risk score (Arnett DK, et al., 2019) failed to calculate for the following reasons:   The 2019 ASCVD risk score is only valid for ages 42 to 46    Assessment & Plan:   Jean Campbell was seen today for obesity.  Diagnoses and all orders for this visit:  Class 1 obesity due to excess calories with serious comorbidity and body mass index (BMI) of 33.0 to 33.9 in adult Unfortunately her insurance does not coverage wegovy or zepbound. Discussed online platforms vs weight loss clinics. Discussed wellbutrin and/or topamax for weight loss. Discussed not a candidate for phentermine treatment due to hx of substance abuse. She will do some research on options for semaglutide. She will let me know if she would prefer to pay out of pocket for [facility] or try  wellbutrin or topamax.   Mixed hyperlipidemia Diet, exercise, weight loss.   Gastroesophageal reflux disease, unspecified whether esophagitis present Discussed symptoms consistent with GERD. She has had a recent RUQ [place]. No hernia palpated on exam. Reports she is unable to take antiacids due to current Hep C treatment.   Chronic hepatitis C without hepatic coma (HCC) Managed by ID. Completing treatment. Last LFTs were normal.    Return if symptoms worsen or fail to improve.  The patient indicates understanding of these issues and agrees with the plan.  Gabriel Earing, FNP

## 2023-11-20 ENCOUNTER — Telehealth: Payer: Self-pay | Admitting: Family Medicine

## 2023-11-20 NOTE — Telephone Encounter (Signed)
Pt has appt Thursday 12/19 at 11 and pt wanted to know if she should be seen sooner. Pt denies fever and advised if she develops fever or the area becomes more red or streaking she should go to UC or call back to see if she can be seen sooner and pt voiced understanding.

## 2023-11-20 NOTE — Telephone Encounter (Signed)
Copied from CRM [id number]. Topic: Appointments - Appointment Scheduling >> Nov 20, 2023  4:20 PM Cassiday T wrote: Patient/patient representative is calling to schedule an appointment. Refer to attachments for appointment information.

## 2023-11-20 NOTE — Telephone Encounter (Signed)
Patient has mash hernia behind her belly button she has some discharged coming from it that spells really bad and discoloration discharge as well she has been having some pain here and there she would like a callback regarding this issue if possible

## 2023-11-21 NOTE — Telephone Encounter (Signed)
Pt scheduled for 11/23/2023

## 2023-11-23 ENCOUNTER — Encounter: Payer: Self-pay | Admitting: Family Medicine

## 2023-11-23 ENCOUNTER — Ambulatory Visit (INDEPENDENT_AMBULATORY_CARE_PROVIDER_SITE_OTHER): Payer: 59 | Admitting: Family Medicine

## 2023-11-23 VITALS — BP 126/80 | HR 88 | Temp 97.8°F | Ht 63.0 in | Wt 190.0 lb

## 2023-11-23 DIAGNOSIS — B3731 Acute candidiasis of vulva and vagina: Secondary | ICD-10-CM | POA: Diagnosis not present

## 2023-11-23 DIAGNOSIS — R1033 Periumbilical pain: Secondary | ICD-10-CM

## 2023-11-23 DIAGNOSIS — N760 Acute vaginitis: Secondary | ICD-10-CM | POA: Diagnosis not present

## 2023-11-23 LAB — WET PREP FOR TRICH, YEAST, CLUE
Clue Cell Exam: NEGATIVE
Trichomonas Exam: NEGATIVE
Yeast Exam: POSITIVE — AB

## 2023-11-23 MED ORDER — FLUCONAZOLE 150 MG PO TABS: 150.0000 mg | ORAL_TABLET | Freq: Once | ORAL | 0 refills | Status: AC

## 2023-11-23 NOTE — Progress Notes (Signed)
   Acute Office Visit  Subjective:     Patient ID: Jean Campbell, female    DOB: 24-Jan-1991, 32 y.o.   MRN: [id number]  Chief Complaint  Patient presents with   Vaginitis    HPI Patient is in today for vaginal itching x 2 days. Also reports vaginal burning, yellow discharge. Hx of yeast vaginitis consistent with these symptoms.   She did have umbilical pain with drainage with foul odor 2 days ago. These symptoms have completely resolved.  ROS As per HPI.      Objective:    BP 126/80   Pulse 88   Temp 97.8 F (36.6 C) (Temporal)   Ht 5\' 3"  (1.6 m)   Wt 190 lb (86.2 kg)   SpO2 98%   BMI 33.66 kg/m    Physical Exam Vitals and nursing note reviewed.  Constitutional:      General: She is not in acute distress.    Appearance: She is obese. She is not ill-appearing, toxic-appearing or diaphoretic.  Abdominal:     Tenderness: There is no abdominal tenderness.     Comments: No drainage, erythema, tenderness, warmth, or fluctuance of umbilicus.   Skin:    General: Skin is warm and dry.  Neurological:     General: No focal deficit present.     Mental Status: She is alert and oriented to person, place, and time.  Psychiatric:        Mood and Affect: Mood normal.        Behavior: Behavior normal.     Microscopic wet-mount exam shows lactobacilli.       Assessment & Plan:   Jadine was seen today for vaginitis.  Diagnoses and all orders for this visit:  Yeast vaginitis + yeast. Diflucan as below. Return to office for new or worsening symptoms, or if symptoms persist.  -     WET PREP FOR TRICH, YEAST, CLUE -     fluconazole (DIFLUCAN) 150 MG tablet; Take 1 tablet (150 mg total) by mouth once for 1 dose. May repeat in 3 days if symptoms persist.  Umbilical pain Now resolved. Normal exam.    The patient indicates understanding of these issues and agrees with the plan.  Gabriel Earing, FNP

## 2023-12-14 ENCOUNTER — Ambulatory Visit (INDEPENDENT_AMBULATORY_CARE_PROVIDER_SITE_OTHER): Payer: Commercial Managed Care - PPO | Admitting: Pharmacist

## 2023-12-14 ENCOUNTER — Other Ambulatory Visit: Payer: Self-pay

## 2023-12-14 DIAGNOSIS — B182 Chronic viral hepatitis C: Secondary | ICD-10-CM | POA: Diagnosis not present

## 2023-12-14 NOTE — Progress Notes (Signed)
 HPI: Jean Campbell is a 33 y.o. female who presents to the [facility] pharmacy clinic for Hepatitis C follow-up.  Medication: Epclusa  x 12 weeks  Start Date: 09/05/2023  Hepatitis C Genotype: 2  Fibrosis Score: F0  Hepatitis C RNA: 824,000 (06/20/2023) > undetectable (10/03/2023)  Patient Active Problem List   Diagnosis Date Noted   Mixed hyperlipidemia 11/16/2023   Class 1 obesity due to excess calories with serious comorbidity and body mass index (BMI) of 33.0 to 33.9 in adult 11/16/2023   Chronic hepatitis C without hepatic coma (HCC) 08/22/2023   Elevated LFTs 06/16/2023   Depression, major, single episode, severe (HCC) 09/02/2021   Cough 07/05/2021   Acute pharyngitis 05/10/2021   IBS (irritable bowel syndrome) 03/29/2021   Alcohol abuse 03/29/2021   GERD (gastroesophageal reflux disease) 03/29/2021   Current smoker 02/28/2017   Depression 07/11/2016   GAD (generalized anxiety disorder) 07/11/2016    Patient's Medications  New Prescriptions   No medications on file  Previous Medications   SOFOSBUVIR -VELPATASVIR  (EPCLUSA ) 400-100 MG TABS    Take 1 tablet by mouth daily.  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    Social History: Social History   Socioeconomic History   Marital status: Single     Spouse name: Not on file   Number of children: 0   Years of education: 15   Highest education level: Some college, no degree  Occupational History   Occupation: [employer]  Tobacco Use   Smoking status: Former    Current packs/day: 0.00    Average packs/day: 0.3 packs/day for 10.3 years (2.6 ttl pk-yrs)    Types: Cigarettes    Start date: 04/02/2012    Quit date: 08/03/2022    Years since quitting: 1.3   Smokeless tobacco: Never  Vaping Use   Vaping status: Never Used  Substance and Sexual Activity   Alcohol use: Yes    Alcohol/week: 3.0 standard drinks of alcohol    Types: 3 Cans of beer per week    Comment: occasionally on weekends   Drug use: Yes    Types: Marijuana    Comment: history of meth use-last used 3 years ago   Sexual activity: Yes    Birth control/protection: Condom  Other Topics Concern   Not on file  Social History Narrative   Not on file   Social Drivers of Health   Financial Resource Strain: High Risk (11/21/2023)   Overall Financial Resource Strain (CARDIA)    Difficulty of Paying Living Expenses: Hard  Food Insecurity: Food Insecurity Present (11/21/2023)   Hunger Vital Sign    Worried About Running Out  of Food in the Last Year: Sometimes true    Ran Out of Food in the Last Year: Sometimes true  Transportation Needs: No Transportation Needs (11/21/2023)   PRAPARE - Administrator, Civil Service (Medical): No    Lack of Transportation (Non-Medical): No  Physical Activity: Sufficiently Active (11/21/2023)   Exercise Vital Sign    Days of Exercise per Week: 6 days    Minutes of Exercise per Session: 60 min  Stress: No Stress Concern Present (11/21/2023)   [employer] of Occupational Health - Occupational Stress Questionnaire    Feeling of Stress : Not at all  Social Connections: Moderately Isolated (11/21/2023)   Social Connection and Isolation Panel [NHANES]    Frequency of Communication with Friends and Family: More than  three times a week    Frequency of Social Gatherings with Friends and Family: Once a week    Attends Religious Services: 1 to 4 times per year    Active Member of [employer] or Organizations: No    Attends Banker Meetings: Not on file    Marital Status: Divorced    Labs: Hepatitis C Lab Results  Component Value Date   HCVGENOTYPE 2 08/22/2023   HCVRNAPCRQN <15 (H) 10/03/2023   FIBROSTAGE F0 08/22/2023   Hepatitis B Lab Results  Component Value Date   HEPBSAB REACTIVE (A) 08/22/2023   HEPBSAG Negative 06/16/2023   HEPBCAB Negative 06/16/2023   Hepatitis A No results found for: HAV HIV Lab Results  Component Value Date   HIV Non Reactive 06/16/2023   HIV Non Reactive 01/15/2021   Lab Results  Component Value Date   CREATININE 0.68 10/03/2023   CREATININE 0.67 08/22/2023   CREATININE 0.82 06/16/2023   CREATININE 0.75 03/13/2023   CREATININE 1.00 04/22/2022   Lab Results  Component Value Date   AST 18 10/03/2023   AST 65 (H) 08/22/2023   AST 89 (H) 06/16/2023   ALT 15 10/03/2023   ALT 132 (H) 08/22/2023   ALT 136 (H) 08/22/2023    Assessment: Donnell presents to clinic today for HCV follow-up as they have completed their full 12 weeks of Epclusa  without any missed doses or side effects. Will check HCV RNA today and follow-up in 3 months with [facility] for SVR.  Due for third HPV and second HAV at follow up in 3 months. She declines COVID but did receive her annual flu vaccine through work.    Plan: - Check HCV RNA - Follow up with [facility] on 4/8  Alan Geralds, PharmD, CPP, BCIDP, AAHIVP Clinical Pharmacist Practitioner Infectious Diseases Clinical Pharmacist [facility]

## 2023-12-17 LAB — HEPATITIS C RNA QUANTITATIVE
HCV Quantitative Log: <1.18 {Log}
HCV RNA, PCR, QN: <15 [IU]/mL

## 2024-01-22 ENCOUNTER — Other Ambulatory Visit: Payer: Self-pay

## 2024-03-04 ENCOUNTER — Encounter: Payer: Self-pay | Admitting: Family Medicine

## 2024-03-04 DIAGNOSIS — Z124 Encounter for screening for malignant neoplasm of cervix: Secondary | ICD-10-CM

## 2024-03-07 ENCOUNTER — Other Ambulatory Visit: Payer: Self-pay

## 2024-03-07 ENCOUNTER — Ambulatory Visit: Admitting: Infectious Diseases

## 2024-03-07 ENCOUNTER — Encounter: Payer: Self-pay | Admitting: Infectious Diseases

## 2024-03-07 VITALS — BP 109/73 | HR 65 | Temp 97.7°F | Ht 63.75 in | Wt 186.0 lb

## 2024-03-07 DIAGNOSIS — Z23 Encounter for immunization: Secondary | ICD-10-CM

## 2024-03-07 DIAGNOSIS — B182 Chronic viral hepatitis C: Secondary | ICD-10-CM

## 2024-03-07 DIAGNOSIS — R7989 Other specified abnormal findings of blood chemistry: Secondary | ICD-10-CM | POA: Diagnosis not present

## 2024-03-07 NOTE — Progress Notes (Signed)
 Patient Name: Jean Campbell  Date of Birth: 03/17/1991  MRN: [id number]  PCP: Gabriel Earing, FNP  Referring Provider: Gabriel Earing, FNP, Ph#: [phone]    CC:  Hep C SVR visit    HPI/ROS:  Jean Campbell is here for last follow up for hepatitis c treatment. She experienced fatigue during the last box but this has since resolved completely.  Medication: Epclusa x 12 weeks   Start Date: 09/05/2023   Hepatitis C Genotype: 2   Fibrosis Score: F0   Hepatitis C RNA:  824,000 (06/20/2023)  Undetectable (10/03/2023) Undetectable (12/10/2023)   Review of Systems  Constitutional:  Negative for appetite change, fatigue, fever and unexpected weight change.  Respiratory:  Negative for shortness of breath.   Cardiovascular:  Negative for chest pain and leg swelling.  Gastrointestinal:  Negative for abdominal pain, blood in stool, nausea and vomiting.  Genitourinary:  Negative for difficulty urinating and hematuria.  Musculoskeletal:  Negative for arthralgias.  Skin:  Negative for color change.  Neurological:  Negative for dizziness, tremors and headaches.  Hematological:  Negative for adenopathy.  Psychiatric/Behavioral:  Negative for confusion.     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 Authorizing Provider  acetaminophen (TYLENOL) 500 MG tablet Take 1 tablet (500 mg total) by mouth every 6 (six) hours as needed. Patient not taking: Reported on 11/15/2022 05/10/21   Daryll Drown, NP   albuterol (VENTOLIN HFA) 108 (90 Base) MCG/ACT inhaler Inhale 2 puffs into the lungs every 6 (six) hours as needed for wheezing or shortness of breath. Patient not taking: Reported on 06/14/2023 11/15/22   Sonny Masters, FNP  aspirin EC 81 MG tablet Take 81 mg by mouth in the morning and at bedtime. Swallow whole.    [provider]  cephALEXin (KEFLEX) 500 MG capsule Take 1 capsule (500 mg total) by mouth 2 (two) times daily. 06/15/23   Gabriel Earing, FNP  cholecalciferol (VITAMIN D3) 25 MCG (1000 UNIT) tablet Take 1,000 Units by mouth daily.    [provider]  fluconazole (DIFLUCAN) 150 MG tablet Take 1 tablet (150 mg total) by mouth every three (3) days as needed. 06/19/23   Gabriel Earing, FNP  meloxicam (MOBIC) 7.5 MG tablet Take 7.5 mg by mouth daily. 06/01/23   [provider]  sucralfate (CARAFATE) 1 g tablet Take 1 tablet (1 g total) by mouth 4 (four) times daily. Patient not taking: Reported on 06/14/2023 03/13/23   Lorre Nick, MD    Allergies  Allergen Reactions   Tilactase Nausea And Vomiting    Other reaction(s): GI Upset (intolerance)   Latex Rash   Sulfa Antibiotics Rash    Childhood allergy     Social History   Tobacco Use   Smoking status: Former    Current packs/day: 0.00  Average packs/day: 0.3 packs/day for 10.3 years (2.6 ttl pk-yrs)    Types: Cigarettes    Start date: 04/02/2012    Quit date: 08/03/2022    Years since quitting: 1.5   Smokeless tobacco: Never  Vaping Use   Vaping status: Never Used  Substance Use Topics   Alcohol use: Yes    Alcohol/week: 3.0 standard drinks of alcohol    Types: 3 Cans of beer per week    Comment: occasionally on weekends   Drug use: Not Currently    Types: Marijuana    Comment: history of meth use-last used 3 years ago    Family History  Problem Relation Age of Onset   Hyperlipidemia Mother    Anxiety disorder Mother    Drug abuse Father    ADD / ADHD Sister    Anxiety disorder  Sister    Drug abuse Sister    Arthritis Maternal Grandmother        rheumatoid   Diabetes Maternal Grandmother    Arthritis Maternal Grandfather        rheumatoid   Arthritis Paternal Grandmother    Hyperlipidemia Paternal Grandmother    Hypertension Paternal Grandmother    Arthritis Paternal Grandfather    Heart disease Paternal Grandfather    Hyperlipidemia Paternal Grandfather    Hypertension Paternal Grandfather    Stroke Paternal Grandfather     Objective:   Vitals:   03/07/24 1304  BP: 109/73  Pulse: 65  Temp: 97.7 F (36.5 C)  SpO2: 100%   Physical Exam Constitutional:      Appearance: Normal appearance. She is not ill-appearing.  HENT:     Mouth/Throat:     Mouth: Mucous membranes are moist.     Pharynx: Oropharynx is clear.  Eyes:     General: No scleral icterus. Cardiovascular:     Rate and Rhythm: Normal rate and regular rhythm.  Pulmonary:     Effort: Pulmonary effort is normal.     Comments: No shortness of breath detected in conversation.  Neurological:     Mental Status: She is oriented to person, place, and time.  Psychiatric:        Mood and Affect: Mood normal.        Behavior: Behavior normal.        Thought Content: Thought content normal.        Judgment: Judgment normal.     Laboratory: Genotype:  Lab Results  Component Value Date   HCVGENOTYPE 2 08/22/2023   HCV viral load: No results found for: "HCVQUANT" Lab Results  Component Value Date   WBC 8.1 08/22/2023   HGB 14.5 08/22/2023   HCT 43.3 08/22/2023   MCV 91.7 08/22/2023   PLT 263 08/22/2023    Lab Results  Component Value Date   CREATININE 0.68 10/03/2023   BUN 9 10/03/2023   NA 137 10/03/2023   K 4.7 10/03/2023   CL 104 10/03/2023   CO2 25 10/03/2023    Lab Results  Component Value Date   ALT 15 10/03/2023   AST 18 10/03/2023   GGT 19 08/22/2023   ALKPHOS 91 06/16/2023    Lab Results  Component Value Date   BILITOT 0.4 10/03/2023   ALBUMIN 4.5  06/16/2023     Imaging:  U/S Liver: 06/2023  Increased echogenicity. No focal lesion. Portal vein is patent on color Doppler imaging with normal direction of blood flow towards the liver.    Assessment & Plan:   Problem List Items  Addressed This Visit       Unprioritized   Chronic hepatitis C without hepatic coma (HCC) - Primary   Will check SVR hep c rna level today. LFTs normalized at [facility] in January. F0 baseline fibrosis screening pretreatment - no ongoing liver cancer screening needed.  Discussed that her antibody blood test will always be positive despite eradication of infection  Discussed liver health principles for ongoing health.   Will message on mychart after blood tests are available but I anticipate this will be her last visit with our team.       Relevant Orders   Hepatitis C RNA quantitative ([facility])   Hepatic function panel   Elevated LFTs   Resolved on treatment       Need for prophylactic vaccination and inoculation against viral hepatitis   Will request her final Hep A vaccine dose with primary care team in July.  She completed her HPV vaccine series today      Other Visit Diagnoses       Need for HPV vaccination       Relevant Orders   HPV 9-valent vaccine,Recombinat (Completed)       No further follow up needed pending lab results.    Rexene Alberts, MSN, NP-C [facility]  [email or web address] Pager: [phone] Office: [phone] [facility] Main Line: [phone]

## 2024-03-07 NOTE — Assessment & Plan Note (Signed)
 Will check SVR hep c rna level today. LFTs normalized at [facility] in January. F0 baseline fibrosis screening pretreatment - no ongoing liver cancer screening needed.  Discussed that her antibody blood test will always be positive despite eradication of infection  Discussed liver health principles for ongoing health.   Will message on mychart after blood tests are available but I anticipate this will be her last visit with our team.

## 2024-03-07 NOTE — Assessment & Plan Note (Signed)
 Will request her final Hep A vaccine dose with primary care team in July.  She completed her HPV vaccine series today

## 2024-03-07 NOTE — Patient Instructions (Addendum)
 It is lovely to see you - I am happy that you are done and we are at the graduation visit!   I will message you with all your results  I will also ask your Primary care provider to help you with your last hepatitis A vaccine in July when you see her.

## 2024-03-07 NOTE — Assessment & Plan Note (Signed)
 Resolved on treatment

## 2024-03-09 LAB — HEPATITIS C RNA QUANTITATIVE
HCV Quantitative Log: <1.18 {Log_IU}/mL
HCV RNA, PCR, QN: <15 [IU]/mL

## 2024-03-09 LAB — HEPATIC FUNCTION PANEL
AG Ratio: 1.6 (calc) (ref 1.0–2.5)
ALT: 18 U/L (ref 6–29)
AST: 19 U/L (ref 10–30)
Albumin: 4.5 g/dL (ref 3.6–5.1)
Alkaline phosphatase (APISO): 60 U/L (ref 31–125)
Bilirubin, Direct: 0.1 mg/dL (ref 0.0–0.2)
Globulin: 2.8 g/dL (ref 1.9–3.7)
Indirect Bilirubin: 0.3 mg/dL (ref 0.2–1.2)
Total Bilirubin: 0.4 mg/dL (ref 0.2–1.2)
Total Protein: 7.3 g/dL (ref 6.1–8.1)

## 2024-03-12 ENCOUNTER — Ambulatory Visit: Payer: Commercial Managed Care - PPO | Admitting: Infectious Diseases

## 2024-03-22 NOTE — Addendum Note (Signed)
 Addended by: Albertha Huger on: 03/22/2024 10:19 AM   Modules accepted: Orders

## 2024-04-03 ENCOUNTER — Encounter: Admitting: Advanced Practice Midwife

## 2024-04-18 DIAGNOSIS — F32A Depression, unspecified: Secondary | ICD-10-CM | POA: Diagnosis not present

## 2024-04-18 DIAGNOSIS — F1020 Alcohol dependence, uncomplicated: Secondary | ICD-10-CM | POA: Diagnosis not present

## 2024-04-18 DIAGNOSIS — F1521 Other stimulant dependence, in remission: Secondary | ICD-10-CM | POA: Diagnosis not present

## 2024-04-18 DIAGNOSIS — F1220 Cannabis dependence, uncomplicated: Secondary | ICD-10-CM | POA: Diagnosis not present

## 2024-04-18 DIAGNOSIS — F419 Anxiety disorder, unspecified: Secondary | ICD-10-CM | POA: Diagnosis not present

## 2024-05-03 DIAGNOSIS — F1220 Cannabis dependence, uncomplicated: Secondary | ICD-10-CM | POA: Diagnosis not present

## 2024-05-03 DIAGNOSIS — F32A Depression, unspecified: Secondary | ICD-10-CM | POA: Diagnosis not present

## 2024-05-03 DIAGNOSIS — F1020 Alcohol dependence, uncomplicated: Secondary | ICD-10-CM | POA: Diagnosis not present

## 2024-05-03 DIAGNOSIS — F419 Anxiety disorder, unspecified: Secondary | ICD-10-CM | POA: Diagnosis not present

## 2024-05-03 DIAGNOSIS — F1521 Other stimulant dependence, in remission: Secondary | ICD-10-CM | POA: Diagnosis not present

## 2024-05-10 DIAGNOSIS — F1020 Alcohol dependence, uncomplicated: Secondary | ICD-10-CM | POA: Diagnosis not present

## 2024-05-10 DIAGNOSIS — F419 Anxiety disorder, unspecified: Secondary | ICD-10-CM | POA: Diagnosis not present

## 2024-05-10 DIAGNOSIS — F32A Depression, unspecified: Secondary | ICD-10-CM | POA: Diagnosis not present

## 2024-05-10 DIAGNOSIS — F1220 Cannabis dependence, uncomplicated: Secondary | ICD-10-CM | POA: Diagnosis not present

## 2024-05-10 DIAGNOSIS — F1521 Other stimulant dependence, in remission: Secondary | ICD-10-CM | POA: Diagnosis not present

## 2024-05-17 DIAGNOSIS — F1020 Alcohol dependence, uncomplicated: Secondary | ICD-10-CM | POA: Diagnosis not present

## 2024-05-17 DIAGNOSIS — F331 Major depressive disorder, recurrent, moderate: Secondary | ICD-10-CM | POA: Diagnosis not present

## 2024-05-17 DIAGNOSIS — F1521 Other stimulant dependence, in remission: Secondary | ICD-10-CM | POA: Diagnosis not present

## 2024-05-17 DIAGNOSIS — F1220 Cannabis dependence, uncomplicated: Secondary | ICD-10-CM | POA: Diagnosis not present

## 2024-05-20 ENCOUNTER — Encounter: Payer: Self-pay | Admitting: Family Medicine

## 2024-05-24 DIAGNOSIS — F1521 Other stimulant dependence, in remission: Secondary | ICD-10-CM | POA: Diagnosis not present

## 2024-05-24 DIAGNOSIS — F330 Major depressive disorder, recurrent, mild: Secondary | ICD-10-CM | POA: Diagnosis not present

## 2024-05-24 DIAGNOSIS — F1020 Alcohol dependence, uncomplicated: Secondary | ICD-10-CM | POA: Diagnosis not present

## 2024-05-24 DIAGNOSIS — F1220 Cannabis dependence, uncomplicated: Secondary | ICD-10-CM | POA: Diagnosis not present

## 2024-05-29 ENCOUNTER — Encounter: Payer: Self-pay | Admitting: Obstetrics and Gynecology

## 2024-05-29 ENCOUNTER — Other Ambulatory Visit: Payer: Self-pay

## 2024-05-29 ENCOUNTER — Ambulatory Visit: Admitting: Obstetrics and Gynecology

## 2024-05-29 ENCOUNTER — Other Ambulatory Visit (HOSPITAL_COMMUNITY)
Admission: RE | Admit: 2024-05-29 | Discharge: 2024-05-29 | Disposition: A | Discharge status: Home / Self Care | Source: Ambulatory Visit | Attending: Obstetrics and Gynecology | Admitting: Obstetrics and Gynecology

## 2024-05-29 VITALS — BP 108/71 | HR 83 | Wt 184.9 lb

## 2024-05-29 DIAGNOSIS — N946 Dysmenorrhea, unspecified: Secondary | ICD-10-CM | POA: Diagnosis not present

## 2024-05-29 DIAGNOSIS — Z1151 Encounter for screening for human papillomavirus (HPV): Secondary | ICD-10-CM

## 2024-05-29 DIAGNOSIS — Z124 Encounter for screening for malignant neoplasm of cervix: Secondary | ICD-10-CM

## 2024-05-29 DIAGNOSIS — Z01419 Encounter for gynecological examination (general) (routine) without abnormal findings: Secondary | ICD-10-CM | POA: Diagnosis not present

## 2024-05-29 DIAGNOSIS — Z3169 Encounter for other general counseling and advice on procreation: Secondary | ICD-10-CM

## 2024-05-29 NOTE — Progress Notes (Signed)
 ANNUAL EXAM Patient name: Jean Campbell MRN [id number]  Date of birth: 03-28-1991 Chief Complaint:   Gynecologic Exam  History of Present Illness:   Jean Campbell is a 33 y.o. No obstetric history on file. being seen today for a routine annual exam.  Current complaints: due for pap smear  Menstrual concerns? No  monthly  Breast or nipple changes? No ; had a prior mammogram and it was negative  Contraception use? No trying to conceive  Sexually active? Yes female partner; he is in methadone clinic and using testosterone and concerned about low sperm count and she is concerned about perimenopause  Patient's last menstrual period was 05/14/2024 (exact date).   The pregnancy intention screening data noted above was reviewed. Potential methods of contraception were discussed. The patient elected to proceed with No data recorded.   Last pap     Component Value Date/Time   DIAGPAP  01/15/2021 0924    - Negative for intraepithelial lesion or malignancy (NILM)   ADEQPAP  01/15/2021 0924    Satisfactory for evaluation; transformation zone component PRESENT.     H/O abnormal pap: yes HPV - mom recently diagnosed with endometrial cancer and had hysterectomy  Last mammogram: n/a.  Last colonoscopy: n/a.      11/23/2023   11:11 AM 11/16/2023   10:33 AM 09/25/2023    9:15 AM 09/12/2023    8:45 AM 08/22/2023    2:11 PM  Depression screen PHQ 2/9  Decreased Interest 0 0 0 0 0  Down, Depressed, Hopeless 0 0 0 0 0  PHQ - 2 Score 0 0 0 0 0  Altered sleeping 0 0 0 0   Tired, decreased energy 0 0 0 0   Change in appetite 0 0 0 0   Feeling bad or failure about yourself  0 0 0 0   Trouble concentrating 0 0 0 0   Moving slowly or fidgety/restless 0 0 0 0   Suicidal thoughts 0 0 0 0   PHQ-9 Score 0 0 0 0   Difficult doing work/chores Not difficult at all Not difficult at all  Not difficult at all         11/23/2023   11:11 AM 11/16/2023   10:32 AM 09/25/2023    9:16 AM 09/12/2023     8:45 AM  GAD 7 : Generalized Anxiety Score  Nervous, Anxious, on Edge 0 0 0 0  Control/stop worrying 0 0 0 0  Worry too much - different things 0 0 0 0  Trouble relaxing 0 0 0 0  Restless 0 0 0 0  Easily annoyed or irritable 0 0 0 0  Afraid - awful might happen 0 0 0 0  Total GAD 7 Score 0 0 0 0  Anxiety Difficulty Not difficult at all Not difficult at all  Not difficult at all     Review of Systems:   Pertinent items are noted in HPI Denies any headaches, blurred vision, fatigue, shortness of breath, chest pain, abdominal pain, abnormal vaginal discharge/itching/odor/irritation, problems with periods, bowel movements, urination, or intercourse unless otherwise stated above. Pertinent History Reviewed:  Reviewed past medical,surgical, social and family history.  Reviewed problem list, medications and allergies. Physical Assessment:   Vitals:   05/29/24 1417  BP: 108/71  Pulse: 83  Weight: 184 lb 14.4 oz (83.9 kg)  Body mass index is 31.99 kg/m.        Physical Examination:   General appearance - well appearing, and  in no distress  Mental status - alert, oriented to person, place, and time  Psych:  She has a normal mood and affect  Skin - warm and dry, normal color, no suspicious lesions noted  Chest - effort normal, all lung fields clear to auscultation bilaterally  Heart - normal rate and regular rhythm  Abdomen - soft, nontender, nondistended, no masses or organomegaly  Pelvic -  VULVA: normal appearing vulva with no masses, tenderness or lesions   VAGINA: normal appearing vagina with normal color and discharge, no lesions   CERVIX: normal appearing cervix without discharge or lesions, no CMT  Thin prep pap is done with HR HPV cotesting  UTERUS: uterus is felt to be normal size, shape, consistency and nontender   ADNEXA: No adnexal masses or tenderness noted.  Extremities:  No swelling or varicosities noted  Chaperone present for exam  No results found for this or  any previous visit (from the past 24 hours).    Assessment & Plan:   1. Well woman exam with routine gynecological exam (Primary) - Cervical cancer screening: Discussed guidelines. Pap with HPV collected - Gardasil: completed - GC/CT: recently completed - Birth Control: trying to conceive - Breast Health: Encouraged self breast awareness/SBE. Teaching provided.  - F/U 12 months and prn   2. Dysmenorrhea 3. Infertility counseling Ordered labs as part of workup. Given known low sperm count and time of attempt to conceive, referral to REI placed.  - TSH Rfx on Abnormal to Free T4 - Hemoglobin A1c - Ambulatory referral to Infertility  4. Encounter for Papanicolaou smear of cervix Pap collected - Cytology - PAP   Orders Placed This Encounter  Procedures   Anti mullerian hormone   TSH Rfx on Abnormal to Free T4   Hemoglobin A1c   Ambulatory referral to Infertility    Meds: No orders of the defined types were placed in this encounter.   Follow-up: No follow-ups on file.  Carter Quarry, MD 05/29/2024 2:39 PM

## 2024-05-30 LAB — TSH RFX ON ABNORMAL TO FREE T4: TSH: 1.52 u[IU]/mL (ref 0.450–4.500)

## 2024-05-31 DIAGNOSIS — F419 Anxiety disorder, unspecified: Secondary | ICD-10-CM | POA: Diagnosis not present

## 2024-05-31 DIAGNOSIS — F331 Major depressive disorder, recurrent, moderate: Secondary | ICD-10-CM | POA: Diagnosis not present

## 2024-05-31 DIAGNOSIS — F1220 Cannabis dependence, uncomplicated: Secondary | ICD-10-CM | POA: Diagnosis not present

## 2024-05-31 DIAGNOSIS — F1521 Other stimulant dependence, in remission: Secondary | ICD-10-CM | POA: Diagnosis not present

## 2024-05-31 DIAGNOSIS — F1020 Alcohol dependence, uncomplicated: Secondary | ICD-10-CM | POA: Diagnosis not present

## 2024-05-31 LAB — CYTOLOGY - PAP
Comment: NEGATIVE
Diagnosis: NEGATIVE
High risk HPV: NEGATIVE

## 2024-06-03 ENCOUNTER — Ambulatory Visit: Payer: Self-pay | Admitting: Obstetrics and Gynecology

## 2024-06-03 LAB — HEMOGLOBIN A1C
Est. average glucose Bld gHb Est-mCnc: 105 mg/dL
Hgb A1c MFr Bld: 5.3 % (ref 4.8–5.6)

## 2024-06-03 LAB — ANTI MULLERIAN HORMONE: ANTI-MULLERIAN HORMONE (AMH): 3.07 ng/mL

## 2024-06-14 DIAGNOSIS — F1521 Other stimulant dependence, in remission: Secondary | ICD-10-CM | POA: Diagnosis not present

## 2024-06-14 DIAGNOSIS — F1220 Cannabis dependence, uncomplicated: Secondary | ICD-10-CM | POA: Diagnosis not present

## 2024-06-14 DIAGNOSIS — F1020 Alcohol dependence, uncomplicated: Secondary | ICD-10-CM | POA: Diagnosis not present

## 2024-06-14 DIAGNOSIS — F331 Major depressive disorder, recurrent, moderate: Secondary | ICD-10-CM | POA: Diagnosis not present

## 2024-06-18 ENCOUNTER — Other Ambulatory Visit: Payer: Self-pay | Admitting: Family

## 2024-06-18 ENCOUNTER — Ambulatory Visit: Payer: Self-pay

## 2024-06-18 ENCOUNTER — Telehealth (INDEPENDENT_AMBULATORY_CARE_PROVIDER_SITE_OTHER): Admitting: Nurse Practitioner

## 2024-06-18 DIAGNOSIS — L089 Local infection of the skin and subcutaneous tissue, unspecified: Secondary | ICD-10-CM

## 2024-06-18 DIAGNOSIS — Z9189 Other specified personal risk factors, not elsewhere classified: Secondary | ICD-10-CM | POA: Diagnosis not present

## 2024-06-18 DIAGNOSIS — R197 Diarrhea, unspecified: Secondary | ICD-10-CM

## 2024-06-18 MED ORDER — METRONIDAZOLE 500 MG PO TABS
500.0000 mg | ORAL_TABLET | Freq: Three times a day (TID) | ORAL | 0 refills | Status: AC
Start: 2024-06-18 — End: 2024-06-25

## 2024-06-18 MED ORDER — FLUCONAZOLE 150 MG PO TABS: ORAL_TABLET | ORAL | 0 refills | Status: DC

## 2024-06-18 NOTE — Telephone Encounter (Signed)
 Appointment scheduled with DOD

## 2024-06-18 NOTE — Progress Notes (Signed)
 Virtual Visit Consent   Jean Campbell, you are scheduled for a virtual visit with Mary-Margaret Gladis, FNP, a [facility] provider, today.     Just as with appointments in the office, your consent must be obtained to participate.  Your consent will be active for this visit and any virtual visit you may have with one of our providers in the next 365 days.     If you have a MyChart account, a copy of this consent can be sent to you electronically.  All virtual visits are billed to your insurance company just like a traditional visit in the office.    As this is a virtual visit, video technology does not allow for your provider to perform a traditional examination.  This may limit your provider's ability to fully assess your condition.  If your provider identifies any concerns that need to be evaluated in person or the need to arrange testing (such as labs, EKG, etc.), we will make arrangements to do so.     Although advances in technology are sophisticated, we cannot ensure that it will always work on either your end or our end.  If the connection with a video visit is poor, the visit may have to be switched to a telephone visit.  With either a video or telephone visit, we are not always able to ensure that we have a secure connection.     I need to obtain your verbal consent now.   Are you willing to proceed with your visit today? YES   Jean Campbell has provided verbal consent on 06/18/2024 for a virtual visit (video or telephone).   Mary-Margaret Gladis, FNP   Date: 06/18/2024 12:59 PM   Virtual Visit via Video Note   I, Mary-Margaret Gladis, connected with Jean Campbell ([id number], Oct 18, 1991) on 06/18/24 at  4:30 PM EDT by a video-enabled telemedicine application and verified that I am speaking with the correct person using two identifiers.  Location: Patient: Virtual Visit Location Patient: Home Provider: Virtual Visit Location Provider: Mobile   I discussed the limitations  of evaluation and management by telemedicine and the availability of in person appointments. The patient expressed understanding and agreed to proceed.    History of Present Illness: Jean Campbell is a 33 y.o. who identifies as a female who was assigned female at birth, and is being seen today for exp tp c diff.  HPI: Patient was exposed to cdiff at work. Now has been having diarrhea for 2 days. Is slimey and runny. 6-7 MB a day. Hard y]=t o hold, has to go as doon as feels symptoms.No abdominal pain.    Review of Systems  Gastrointestinal:  Positive for abdominal pain and diarrhea. Negative for blood in stool.    Problems:  Patient Active Problem List   Diagnosis Date Noted   Need for prophylactic vaccination and inoculation against viral hepatitis 03/07/2024   Mixed hyperlipidemia 11/16/2023   Class 1 obesity due to excess calories with serious comorbidity and body mass index (BMI) of 33.0 to 33.9 in adult 11/16/2023   Chronic hepatitis C without hepatic coma (HCC) 08/22/2023   Elevated LFTs 06/16/2023   Depression, major, single episode, severe (HCC) 09/02/2021   Cough 07/05/2021   Acute pharyngitis 05/10/2021   IBS (irritable bowel syndrome) 03/29/2021   Alcohol abuse 03/29/2021   GERD (gastroesophageal reflux disease) 03/29/2021   Current smoker 02/28/2017   Depression 07/11/2016   GAD (generalized anxiety disorder) 07/11/2016  Allergies:  Allergies  Allergen Reactions   Tilactase Nausea And Vomiting    Other reaction(s): GI Upset (intolerance)   Latex Rash   Sulfa Antibiotics Rash    Childhood allergy    Medications: No current outpatient medications on file.  Observations/Objective: Patient is well-developed, well-nourished in no acute distress.  Resting comfortably  at home.  Head is normocephalic, atraumatic.  No labored breathing.  Speech is clear and coherent with logical content.  Patient is alert and oriented at baseline.  No abdominal  pain  Assessment and Plan:  Jean Campbell in today with chief complaint of No chief complaint on file.   1. Diarrhea of presumed infectious origin (Primary) Good handwashing 'force fluids RTO prn - metroNIDAZOLE  (FLAGYL ) 500 MG tablet; Take 1 tablet (500 mg total) by mouth 3 (three) times daily for 7 days.  Dispense: 21 tablet; Refill: 0 - fluconazole  (DIFLUCAN ) 150 MG tablet; 1 po q week x 4 weeks  Dispense: 4 tablet; Refill: 0  2. At risk for Clostridium difficile infection Labs pending Patient will pick up specimen containers - Cdiff NAA+O+P+Stool Culture   Follow Up Instructions: I discussed the assessment and treatment plan with the patient. The patient was provided an opportunity to ask questions and all were answered. The patient agreed with the plan and demonstrated an understanding of the instructions.  A copy of instructions were sent to the patient via MyChart.  The patient was advised to call back or seek an in-person evaluation if the symptoms worsen or if the condition fails to improve as anticipated.  Time:  I spent 9 minutes with the patient via telehealth technology discussing the above problems/concerns.    Mary-Margaret Gladis, FNP

## 2024-06-18 NOTE — Patient Instructions (Signed)
C. Diff Infection C. diffinfection, or C. diff, is an infection that is caused by C. diff germs. These germs, called bacteria, cause watery poop (diarrhea) and very bad inflammation in the colon. This infection often happens after taking antibiotics. C. diffcan spread easily to others. What are the causes? Taking certain antibiotics. Coming in contact with people, food, or things that have the germ on it. What increases the risk? Taking certain antibiotics for a long time. Staying in a hospital or nursing home for a long time. Being age 35 or older. Having had C. diff infection before or been exposed to C. diff. Having a weak disease-fighting, or immune, system. Having serious health problems, including: Colon cancer. Inflammatory bowel disease (IBD). Taking medicines that treat stomach acid. Having had surgery on your digestive system. What are the signs or symptoms? Watery poop. Fever. Feeling like you may vomit. Not feeling hungry. Swelling, pain, cramps, or a tender belly. How is this treated? Treatment may include: Stopping the antibiotics that caused the C. diff infection. Taking antibiotics that kill C. diff. Placing poop from a healthy person into your colon. This is called a fecal transplant. Having surgery to take out the infected part of the colon. This is rare. Follow these instructions at home: Medicines Take over-the-counter and prescription medicines only as told by your doctor. Take your antibiotics as told by your doctor. Do not stop taking them even if you start to feel better. Do not take medicines that treat watery poop unless your doctor tells you to. Eating and drinking Follow instructions from your  doctor about what you may eat and drink. Eat bland foods in small amounts, such as: Bananas. Applesauce. Rice. Toast. Avoid milk, caffeine, and alcohol. To prevent loss of fluid in your body, or dehydration: Drink clear fluids to keep your pee (urine) pale yellow. This includes water, ice chips, clear fruit juice with water added to it, or low-calorie sports drinks. Have a drink called an oral rehydration solution (ORS). You can buy this drink at pharmacies and retail stores. General instructions Wash your hands often with soap and water. Do this for at least 20 seconds. Take a bath or shower every day. Return to your normal activities when your doctor says that it's safe. Be sure your home is clean before you leave the hospital or clinic. Clean every day for at least a week. Keep all follow-up visits to make sure your infection is gone. How is this prevented? Wash [employer] your hands often with soap and water for at least 20 seconds. Wash your hands before you cook and after you use the bathroom. Other people should wash their hands too, especially: People who live with you. People who visit you  in a hospital or clinic. Stop germs from spreading Tell your doctor if you get watery poop while you are in the hospital or nursing home. When you visit someone in the hospital or nursing home, wear a gown, gloves, or other protection. Try to: Stay away from people who have watery poop. Use a different bathroom if you're sick and live with other people. Clean surfaces Clean surfaces that you touch every day. Use a product that has a 10% chlorine bleach solution. Be sure to: Read the product label to make sure that the product will kill the germs on your surfaces. Clean toilets and flush handles, bathtubs, sinks, doorknobs and handles, countertops, and work surfaces. If you're in the hospital, make sure the surfaces in your room are cleaned each day. Tell someone right away if body fluids  have splashed or spilled. Wash clothes and linens Wash clothes and linens using laundry soap that has chlorine bleach. Be sure to: Use powder soap instead of liquid. Clean your washing machine once a month. To do this, turn on the hot setting with only soap in it. Contact a doctor if: Your symptoms do not get better or get worse. Your symptoms go away and then come back. You have a fever. You have new symptoms. Get help right away if: Your belly is more tender or painful. Your poop is bloody. Your poop looks black. You vomit every time you eat or drink. You have signs of not having enough fluids in your body. These include: Dark yellow pee, very little pee, or no pee. Cracked lips or dry mouth. No tears when you cry. Sunken eyes. Feeling tired. Feeling weak or dizzy. This information is not intended to replace advice given to you by your health care provider. Make sure you discuss any questions you have with your health care provider. Document Revised: 02/13/2023 Document Reviewed: 02/13/2023 Elsevier Patient Education  2024 [employer].

## 2024-06-18 NOTE — Telephone Encounter (Signed)
 FYI Only or Action Required?: FYI only for provider.  Patient was last seen in primary care on 11/23/2023 by Joesph Annabella HERO, FNP.  Called Nurse Triage reporting Diarrhea. - c-diff exposure  Symptoms began yesterday.  Interventions attempted: Nothing.  Symptoms are: rapidly worsening.  Triage Disposition: Go to ED Now (Notify PCP)  Patient/caregiver understands and will follow disposition?: PT does not want to go to ED - call transferred to [facility] - she will schedule for today                  Summary: Diarrhea   Pt believes she picked up C Diff at work, has had diarrhea for two days straight. Scheduled tomorrow, seeking medicine today     Reason for Disposition  [1] Blood in the stool AND [2] moderate or large amount of blood  Answer Assessment - Initial Assessment Questions 1. DIARRHEA SEVERITY: How bad is the diarrhea? How many more stools have you had in the past 24 hours than normal?      More than 24 2. ONSET: When did the diarrhea begin?      yesterday 3. STOOL DESCRIPTION:  How loose or watery is the diarrhea? What is the stool color? Is there any blood or mucous in the stool?     Coating on water and mucous - and blood 4. VOMITING: Are you also vomiting? If Yes, ask: How many times in the past 24 hours?      no 5. ABDOMEN PAIN: Are you having any abdomen pain? If Yes, ask: What does it feel like? (e.g., crampy, dull, intermittent, constant)      yes 6. ABDOMEN PAIN SEVERITY: If present, ask: How bad is the pain?  (e.g., Scale 1-10; mild, moderate, or severe)     yes 7. ORAL INTAKE: If vomiting, Have you been able to drink liquids? How much liquids have you had in the past 24 hours?     Lots of liquids 8. HYDRATION: Any signs of dehydration? (e.g., dry mouth [not just dry lips], too weak to stand, dizziness, new weight loss) When did you last urinate?     no 9. EXPOSURE: Have you traveled to a foreign country recently?  Have you been exposed to anyone with diarrhea? Could you have eaten any food that was spoiled?     C-Diff at work 10. ANTIBIOTIC USE: Are you taking antibiotics now or have you taken antibiotics in the past 2 months?       no 11. OTHER SYMPTOMS: Do you have any other symptoms? (e.g., fever, blood in stool)       Hot sweats - body aches 12. PREGNANCY: Is there any chance you are pregnant? When was your last menstrual period?       no  Protocols used: [facility]

## 2024-06-19 ENCOUNTER — Encounter: Payer: 59 | Admitting: Family Medicine

## 2024-06-19 ENCOUNTER — Telehealth: Payer: Self-pay

## 2024-06-19 NOTE — Telephone Encounter (Signed)
 Copied from CRM [id number]. Topic: Clinical - Medical Advice >> Jun 19, 2024  2:02 PM Carlatta H wrote: Reason for CRM: Patient called to advise that she has not taken any of the medication yet because she has not bowel movement// She will come pick up stool sample kit for when she does//

## 2024-06-19 NOTE — Telephone Encounter (Signed)
Noted, will close encounter. 

## 2024-06-21 DIAGNOSIS — F1220 Cannabis dependence, uncomplicated: Secondary | ICD-10-CM | POA: Diagnosis not present

## 2024-06-21 DIAGNOSIS — F1020 Alcohol dependence, uncomplicated: Secondary | ICD-10-CM | POA: Diagnosis not present

## 2024-06-21 DIAGNOSIS — F32A Depression, unspecified: Secondary | ICD-10-CM | POA: Diagnosis not present

## 2024-06-21 DIAGNOSIS — F1521 Other stimulant dependence, in remission: Secondary | ICD-10-CM | POA: Diagnosis not present

## 2024-07-05 ENCOUNTER — Ambulatory Visit: Admitting: Family Medicine

## 2024-07-05 VITALS — BP 93/57 | HR 63 | Temp 97.8°F | Ht 63.75 in | Wt 177.0 lb

## 2024-07-05 DIAGNOSIS — Z6830 Body mass index (BMI) 30.0-30.9, adult: Secondary | ICD-10-CM | POA: Diagnosis not present

## 2024-07-05 DIAGNOSIS — Z8249 Family history of ischemic heart disease and other diseases of the circulatory system: Secondary | ICD-10-CM | POA: Diagnosis not present

## 2024-07-05 DIAGNOSIS — F339 Major depressive disorder, recurrent, unspecified: Secondary | ICD-10-CM

## 2024-07-05 DIAGNOSIS — Z0001 Encounter for general adult medical examination with abnormal findings: Secondary | ICD-10-CM | POA: Diagnosis not present

## 2024-07-05 DIAGNOSIS — E66811 Obesity, class 1: Secondary | ICD-10-CM | POA: Diagnosis not present

## 2024-07-05 DIAGNOSIS — Z1283 Encounter for screening for malignant neoplasm of skin: Secondary | ICD-10-CM

## 2024-07-05 DIAGNOSIS — Z6833 Body mass index (BMI) 33.0-33.9, adult: Secondary | ICD-10-CM | POA: Diagnosis not present

## 2024-07-05 DIAGNOSIS — E782 Mixed hyperlipidemia: Secondary | ICD-10-CM

## 2024-07-05 DIAGNOSIS — E6609 Other obesity due to excess calories: Secondary | ICD-10-CM | POA: Diagnosis not present

## 2024-07-05 DIAGNOSIS — F411 Generalized anxiety disorder: Secondary | ICD-10-CM

## 2024-07-05 DIAGNOSIS — Z0000 Encounter for general adult medical examination without abnormal findings: Secondary | ICD-10-CM

## 2024-07-05 LAB — LIPID PANEL

## 2024-07-05 NOTE — Progress Notes (Signed)
 Complete physical exam  Patient: Jean Campbell   DOB: 02-20-1991   33 y.o. Female  MRN: [id number]  Subjective:    Chief Complaint  Patient presents with   Annual Exam    Jean Campbell is a 33 y.o. female who presents today for a complete physical exam. She reports consuming a well balanced diet. She walks 25K steps a day, goes to the gym 2-3x week for strength training. She generally feels fairly well. She reports sleeping fairly well. She does have additional problems to discuss today.   She would like to discuss FMLA for anxiety and depression. She is current in counseling with [facility] weekly. She has stopped drinking completing. She has remained clean from substances. Her symptoms are up and down. She does get overwhelmed with life circumstances at time. Struggles with productivity sometimes at work. Struggles significantly with getting housework and household responsibilities done, especially in a timely manner. WHODAS 2.0 score of 44- moderate difficulty. She does sometimes need time off work to help manage her symptoms. Her manager suggestted FMLA as an option for her to seek. She reports an average of 1 absence a month for up to 3 days at most.   Mother, grandparents, parental uncle, and maternal uncle with history of heart disease. Her mother has mentioned calcium scoring. She has questions regarding this.  Also would like referral to derm for screening. She has had pre-cancerous lesions removed in the past. There is a family hx of melanoma.   Most recent fall risk assessment:    07/05/2024    9:24 AM  Fall Risk   Falls in the past year? 0  Risk for fall due to : History of fall(s)  Follow up Falls evaluation completed     Most recent depression screenings:    07/05/2024    9:24 AM 11/23/2023   11:11 AM  PHQ 2/9 Scores  PHQ - 2 Score 0 0  PHQ- 9 Score 0 0        Patient Care Team: Joesph Annabella HERO, FNP as PCP - General (Family Medicine)   Outpatient  Medications Prior to Visit  Medication Sig   [DISCONTINUED] fluconazole  (DIFLUCAN ) 150 MG tablet 1 po q week x 4 weeks   No facility-administered medications prior to visit.    ROS Negative unless specially indicated above in HPI.     Objective:     BP (!) 93/57   Pulse 63   Temp 97.8 F (36.6 C) (Temporal)   Ht 5' 3.75 (1.619 m)   Wt 177 lb (80.3 kg)   SpO2 99%   BMI 30.62 kg/m  Wt Readings from Last 3 Encounters:  07/05/24 177 lb (80.3 kg)  05/29/24 184 lb 14.4 oz (83.9 kg)  03/07/24 186 lb (84.4 kg)      Physical Exam Vitals and nursing note reviewed.  Constitutional:      General: She is not in acute distress.    Appearance: She is obese. She is not ill-appearing, toxic-appearing or diaphoretic.  HENT:     Head: Normocephalic.     Right Ear: Tympanic membrane, ear canal and external ear normal.     Left Ear: Tympanic membrane, ear canal and external ear normal.     Nose: Nose normal.     Mouth/Throat:     Mouth: Mucous membranes are dry.     Pharynx: Oropharynx is clear.  Eyes:     Extraocular Movements: Extraocular movements intact.     Conjunctiva/sclera:  Conjunctivae normal.     Pupils: Pupils are equal, round, and reactive to light.  Neck:     Thyroid : No thyroid  mass, thyromegaly or thyroid  tenderness.  Cardiovascular:     Rate and Rhythm: Normal rate and regular rhythm.     Pulses: Normal pulses.     Heart sounds: Normal heart sounds. No murmur heard.    No friction rub. No gallop.  Pulmonary:     Effort: Pulmonary effort is normal.     Breath sounds: Normal breath sounds.  Abdominal:     General: Bowel sounds are normal. There is no distension.     Palpations: Abdomen is soft. There is no mass.     Tenderness: There is no abdominal tenderness. There is no guarding.  Musculoskeletal:     Cervical back: Normal range of motion and neck supple. No tenderness.     Right lower leg: No edema.     Left lower leg: No edema.  Skin:    General: Skin  is warm and dry.     Capillary Refill: Capillary refill takes less than 2 seconds.     Findings: No lesion or rash.  Neurological:     General: No focal deficit present.     Mental Status: She is alert and oriented to person, place, and time.     Cranial Nerves: No cranial nerve deficit.     Motor: No weakness.     Gait: Gait normal.  Psychiatric:        Mood and Affect: Mood normal.        Behavior: Behavior normal.        Thought Content: Thought content normal.        Judgment: Judgment normal.      No results found for any visits on 07/05/24.     Assessment & Plan:    Routine Health Maintenance and Physical Exam  Bich was seen today for annual exam.  Diagnoses and all orders for this visit:  Routine general medical examination at a health care facility  Class 1 obesity due to excess calories with serious comorbidity and body mass index (BMI) of 30.0 to 30.9 in adult Fasting labs pending.  -     CBC with Differential/Platelet -     CMP14+EGFR -     Lipid panel -     TSH  Mixed hyperlipidemia Fasting lipid panel pending.   Depression, recurrent (HCC) GAD (generalized anxiety disorder) WHODAS 2.0 with moderate difficulty. Will complete intermittent FMLA for patient for 1 episode every month for up to 3 days. Continue counseling.   Skin cancer screening -     Ambulatory referral to Dermatology  Family history of heart disease Discussed that calcium score if helpful for patient at 40 years or older with moderate risk to help determine if a statin is indicated. Will hold off at this time as she is low risk and 33 years old.    Immunization History  Administered Date(s) Administered   HPV 9-valent 10/03/2023, 03/07/2024   Hepatitis A, Adult 10/03/2023   Hpv-Unspecified 03/20/2014   Influenza-Unspecified 08/31/2015   Rabies, IM 11/08/2019   Tdap 03/20/2014, 11/08/2019    Health Maintenance  Topic Date Due   Hepatitis B Vaccines (1 of 3 - 19+ 3-dose series)  Never done   COVID-19 Vaccine (1) 12/08/2024 (Originally 12/06/1993)   INFLUENZA VACCINE  03/04/2025 (Originally 07/05/2024)   Pneumococcal Vaccine: 19-49 Years (1 of 2 - PCV) 07/05/2025 (Originally 12/07/2007)   Cervical Cancer  Screening (HPV/Pap Cotest)  05/29/2029   DTaP/Tdap/Td (3 - Td or Tdap) 11/07/2029   HPV VACCINES  Completed   Hepatitis C Screening  Completed   HIV Screening  Completed   Meningococcal B Vaccine  Aged Out    Discussed health benefits of physical activity, and encouraged her to engage in regular exercise appropriate for her age and condition.  Problem List Items Addressed This Visit       Other   Depression, recurrent (HCC)   GAD (generalized anxiety disorder)   Mixed hyperlipidemia   Class 1 obesity due to excess calories with serious comorbidity and body mass index (BMI) of 33.0 to 33.9 in adult   Other Visit Diagnoses       Skin cancer screening    -  Primary   Relevant Orders   Ambulatory referral to Dermatology      Return in 6 months (on 01/05/2025).   The patient indicates understanding of these issues and agrees with the plan.  Annabella CHRISTELLA Search, FNP

## 2024-07-05 NOTE — Patient Instructions (Addendum)
 Who should get a calcium score test?  A calcium scoring test can assist healthcare providers in making treatment decisions for people with borderline risk of heart disease. Calcium score testing results could help you if you're between ages 53 and 84 and at increased risk for heart disease but don't have symptoms.  People at increased risk include those who:  Have a family history of heart disease. Use tobacco products now or in the past. Have a history of high cholesterol, diabetes or high blood pressure. Have overweight (a body mass index, or BMI, higher than 25) or obesity (a BMI higher than 30). Have an inactive lifestyle. Have other, nontraditional risk factors. If you're younger than 35 years old and high cholesterol runs in your family (familial hypercholesterolemia), you might consider calcium score testing.  Because this scan can't detect certain forms of coronary disease (such as "soft plaque" atherosclerosis), this test isn't absolute in predicting your risk for a heart attack or stroke. It's another tool your provider can use to decide whether you need a statin. Health Maintenance, Female Adopting a healthy lifestyle and getting preventive care are important in promoting health and wellness. Ask your health care provider about: The right schedule for you to have regular tests and exams. Things you can do on your own to prevent diseases and keep yourself healthy. What should I know about diet, weight, and exercise? Eat a healthy diet  Eat a diet that includes plenty of vegetables, fruits, low-fat dairy products, and lean protein. Do not eat a lot of foods that are high in solid fats, added sugars, or sodium. Maintain a healthy weight Body mass index (BMI) is used to identify weight problems. It estimates body fat based on height and weight. Your health care provider can help determine your BMI and help you achieve or maintain a healthy weight. Get regular exercise Get regular  exercise. This is one of the most important things you can do for your health. Most adults should: Exercise for at least 150 minutes each week. The exercise should increase your heart rate and make you sweat (moderate-intensity exercise). Do strengthening exercises at least twice a week. This is in addition to the moderate-intensity exercise. Spend less time sitting. Even light physical activity can be beneficial. Watch cholesterol and blood lipids Have your blood tested for lipids and cholesterol at 35 years of age, then have this test every 5 years. Have your cholesterol levels checked more often if: Your lipid or cholesterol levels are high. You are older than 35 years of age. You are at high risk for heart disease. What should I know about cancer screening? Depending on your health history and family history, you may need to have cancer screening at various ages. This may include screening for: Breast cancer. Cervical cancer. Colorectal cancer. Skin cancer. Lung cancer. What should I know about heart disease, diabetes, and high blood pressure? Blood pressure and heart disease High blood pressure causes heart disease and increases the risk of stroke. This is more likely to develop in people who have high blood pressure readings or are overweight. Have your blood pressure checked: Every 3-5 years if you are 74-49 years of age. Every year if you are 94 years old or older. Diabetes Have regular diabetes screenings. This checks your fasting blood sugar level. Have the screening done: Once every three years after age 80 if you are at a normal weight and have a low risk for diabetes. More often and at a younger age  if you are overweight or have a high risk for diabetes. What should I know about preventing infection? Hepatitis B If you have a higher risk for hepatitis B, you should be screened for this virus. Talk with your health care provider to find out if you are at risk for hepatitis B  infection. Hepatitis C Testing is recommended for: Everyone born from [birth year] through 1967. Anyone with known risk factors for hepatitis C. Sexually transmitted infections (STIs) Get screened for STIs, including gonorrhea and chlamydia, if: You are sexually active and are younger than 35 years of age. You are older than 36 years of age and your health care provider tells you that you are at risk for this type of infection. Your sexual activity has changed since you were last screened, and you are at increased risk for chlamydia or gonorrhea. Ask your health care provider if you are at risk. Ask your health care provider about whether you are at high risk for HIV. Your health care provider may recommend a prescription medicine to help prevent HIV infection. If you choose to take medicine to prevent HIV, you should first get tested for HIV. You should then be tested every 3 months for as long as you are taking the medicine. Pregnancy If you are about to stop having your period (premenopausal) and you may become pregnant, seek counseling before you get pregnant. Take 400 to 800 micrograms (mcg) of folic acid  every day if you become pregnant. Ask for birth control (contraception) if you want to prevent pregnancy. Osteoporosis and menopause Osteoporosis is a disease in which the bones lose minerals and strength with aging. This can result in bone fractures. If you are 73 years old or older, or if you are at risk for osteoporosis and fractures, ask your health care provider if you should: Be screened for bone loss. Take a calcium or vitamin D supplement to lower your risk of fractures. Be given hormone replacement therapy (HRT) to treat symptoms of menopause. Follow these instructions at home: Alcohol use Do not drink alcohol if: Your health care provider tells you not to drink. You are pregnant, may be pregnant, or are planning to become pregnant. If you drink alcohol: Limit how much you have  to: 0-1 drink a day. Know how much alcohol is in your drink. In the [place] one drink equals one 12 oz bottle of beer (355 mL), one 5 oz glass of wine (148 mL), or one 1 oz glass of hard liquor (44 mL). Lifestyle Do not use any products that contain nicotine or tobacco. These products include cigarettes, chewing tobacco, and vaping devices, such as e-cigarettes. If you need help quitting, ask your health care provider. Do not use street drugs. Do not share needles. Ask your health care provider for help if you need support or information about quitting drugs. General instructions Schedule regular health, dental, and eye exams. Stay current with your vaccines. Tell your health care provider if: You often feel depressed. You have ever been abused or do not feel safe at home. Summary Adopting a healthy lifestyle and getting preventive care are important in promoting health and wellness. Follow your health care provider's instructions about healthy diet, exercising, and getting tested or screened for diseases. Follow your health care provider's instructions on monitoring your cholesterol and blood pressure. This information is not intended to replace advice given to you by your health care provider. Make sure you discuss any questions you have with your health care provider. Document  Revised: 04/12/2021 Document Reviewed: 04/12/2021 Elsevier Patient Education  2024 [employer].

## 2024-07-06 LAB — LIPID PANEL
Chol/HDL Ratio: 4.8 ratio — ABNORMAL HIGH (ref 0.0–4.4)
Cholesterol, Total: 195 mg/dL (ref 100–199)
HDL: 41 mg/dL (ref >39)
LDL Chol Calc (NIH): 139 mg/dL — ABNORMAL HIGH (ref 0–99)
Triglycerides: 84 mg/dL (ref 0–149)
VLDL Cholesterol Cal: 15 mg/dL (ref 5–40)

## 2024-07-06 LAB — CMP14+EGFR
ALT: 14 IU/L (ref 0–32)
AST: 17 IU/L (ref 0–40)
Albumin: 4.4 g/dL (ref 3.9–4.9)
Alkaline Phosphatase: 53 IU/L (ref 44–121)
BUN/Creatinine Ratio: 11 (ref 9–23)
BUN: 9 mg/dL (ref 6–20)
Bilirubin Total: 0.4 mg/dL (ref 0.0–1.2)
CO2: 18 mmol/L — ABNORMAL LOW (ref 20–29)
Calcium: 8.9 mg/dL (ref 8.7–10.2)
Chloride: 104 mmol/L (ref 96–106)
Creatinine, Ser: 0.81 mg/dL (ref 0.57–1.00)
Globulin, Total: 2.4 g/dL (ref 1.5–4.5)
Glucose: 96 mg/dL (ref 70–99)
Potassium: 4.1 mmol/L (ref 3.5–5.2)
Sodium: 137 mmol/L (ref 134–144)
Total Protein: 6.8 g/dL (ref 6.0–8.5)
eGFR: 97 mL/min/1.73 (ref >59)

## 2024-07-06 LAB — CBC WITH DIFFERENTIAL/PLATELET
Basophils Absolute: 0 x10E3/uL (ref 0.0–0.2)
Basos: 0 %
EOS (ABSOLUTE): 0.1 x10E3/uL (ref 0.0–0.4)
Eos: 1 %
Hematocrit: 40.7 % (ref 34.0–46.6)
Hemoglobin: 13.4 g/dL (ref 11.1–15.9)
Immature Grans (Abs): 0 x10E3/uL (ref 0.0–0.1)
Immature Granulocytes: 0 %
Lymphocytes Absolute: 2.4 x10E3/uL (ref 0.7–3.1)
Lymphs: 35 %
MCH: 30.7 pg (ref 26.6–33.0)
MCHC: 32.9 g/dL (ref 31.5–35.7)
MCV: 93 fL (ref 79–97)
Monocytes Absolute: 0.5 x10E3/uL (ref 0.1–0.9)
Monocytes: 7 %
Neutrophils Absolute: 3.9 x10E3/uL (ref 1.4–7.0)
Neutrophils: 57 %
Platelets: 247 x10E3/uL (ref 150–450)
RBC: 4.36 x10E6/uL (ref 3.77–5.28)
RDW: 12 % (ref 11.7–15.4)
WBC: 6.9 x10E3/uL (ref 3.4–10.8)

## 2024-07-06 LAB — TSH: TSH: 1.69 u[IU]/mL (ref 0.450–4.500)

## 2024-07-08 ENCOUNTER — Encounter: Payer: Self-pay | Admitting: Family Medicine

## 2024-07-08 ENCOUNTER — Ambulatory Visit: Payer: Self-pay | Admitting: Family Medicine

## 2024-07-12 DIAGNOSIS — F1220 Cannabis dependence, uncomplicated: Secondary | ICD-10-CM | POA: Diagnosis not present

## 2024-07-12 DIAGNOSIS — F1521 Other stimulant dependence, in remission: Secondary | ICD-10-CM | POA: Diagnosis not present

## 2024-07-12 DIAGNOSIS — F331 Major depressive disorder, recurrent, moderate: Secondary | ICD-10-CM | POA: Diagnosis not present

## 2024-07-12 DIAGNOSIS — F1020 Alcohol dependence, uncomplicated: Secondary | ICD-10-CM | POA: Diagnosis not present

## 2024-08-22 DIAGNOSIS — F331 Major depressive disorder, recurrent, moderate: Secondary | ICD-10-CM | POA: Diagnosis not present

## 2024-08-29 DIAGNOSIS — F331 Major depressive disorder, recurrent, moderate: Secondary | ICD-10-CM | POA: Diagnosis not present

## 2024-09-12 DIAGNOSIS — F330 Major depressive disorder, recurrent, mild: Secondary | ICD-10-CM | POA: Diagnosis not present

## 2024-09-12 DIAGNOSIS — F419 Anxiety disorder, unspecified: Secondary | ICD-10-CM | POA: Diagnosis not present

## 2024-09-19 DIAGNOSIS — F419 Anxiety disorder, unspecified: Secondary | ICD-10-CM | POA: Diagnosis not present

## 2024-09-19 DIAGNOSIS — F32A Depression, unspecified: Secondary | ICD-10-CM | POA: Diagnosis not present

## 2024-09-27 ENCOUNTER — Other Ambulatory Visit: Payer: Self-pay | Admitting: Pharmacist

## 2024-09-27 NOTE — Progress Notes (Signed)
 Specialty Pharmacy Ongoing Clinical Assessment Note  Jean Campbell is a 33 y.o. female who is being followed by the specialty pharmacy service for RxSp Hepatitis C   Patient's specialty medication(s) reviewed today: Sofosbuvir -Velpatasvir    Missed doses in the last 4 weeks: 0   Patient/Caregiver did not have any additional questions or concerns.   Therapeutic benefit summary: Patient is achieving benefit   Adverse events/side effects summary: No adverse events/side effects   Patient's therapy is appropriate to: Continue    Goals Addressed             This Visit's Progress    Achieve virologic cure as evidenced by SVR   On track    Patient is initiating therapy. Patient will be evaluated at upcoming provider appointment to assess progress         Follow up: none required -therapy completed  Alan JINNY Geralds Specialty Pharmacist

## 2024-10-03 DIAGNOSIS — F330 Major depressive disorder, recurrent, mild: Secondary | ICD-10-CM | POA: Diagnosis not present

## 2024-10-03 DIAGNOSIS — F419 Anxiety disorder, unspecified: Secondary | ICD-10-CM | POA: Diagnosis not present

## 2024-10-17 DIAGNOSIS — F330 Major depressive disorder, recurrent, mild: Secondary | ICD-10-CM | POA: Diagnosis not present

## 2024-10-17 DIAGNOSIS — F419 Anxiety disorder, unspecified: Secondary | ICD-10-CM | POA: Diagnosis not present

## 2024-10-21 DIAGNOSIS — M79662 Pain in left lower leg: Secondary | ICD-10-CM | POA: Diagnosis not present

## 2024-10-30 DIAGNOSIS — F330 Major depressive disorder, recurrent, mild: Secondary | ICD-10-CM | POA: Diagnosis not present

## 2024-11-06 DIAGNOSIS — S86812A Strain of other muscle(s) and tendon(s) at lower leg level, left leg, initial encounter: Secondary | ICD-10-CM | POA: Diagnosis not present

## 2024-11-06 DIAGNOSIS — M79605 Pain in left leg: Secondary | ICD-10-CM | POA: Diagnosis not present

## 2024-11-08 DIAGNOSIS — M79662 Pain in left lower leg: Secondary | ICD-10-CM | POA: Diagnosis not present

## 2024-11-14 DIAGNOSIS — F419 Anxiety disorder, unspecified: Secondary | ICD-10-CM | POA: Diagnosis not present

## 2024-11-14 DIAGNOSIS — S86812A Strain of other muscle(s) and tendon(s) at lower leg level, left leg, initial encounter: Secondary | ICD-10-CM | POA: Diagnosis not present

## 2024-11-14 DIAGNOSIS — F330 Major depressive disorder, recurrent, mild: Secondary | ICD-10-CM | POA: Diagnosis not present

## 2024-11-14 DIAGNOSIS — M79605 Pain in left leg: Secondary | ICD-10-CM | POA: Diagnosis not present

## 2024-11-19 DIAGNOSIS — B354 Tinea corporis: Secondary | ICD-10-CM | POA: Diagnosis not present

## 2024-11-19 DIAGNOSIS — D2372 Other benign neoplasm of skin of left lower limb, including hip: Secondary | ICD-10-CM | POA: Diagnosis not present

## 2024-11-19 DIAGNOSIS — L570 Actinic keratosis: Secondary | ICD-10-CM | POA: Diagnosis not present

## 2024-11-19 DIAGNOSIS — D2261 Melanocytic nevi of right upper limb, including shoulder: Secondary | ICD-10-CM | POA: Diagnosis not present

## 2024-11-19 DIAGNOSIS — L821 Other seborrheic keratosis: Secondary | ICD-10-CM | POA: Diagnosis not present

## 2024-11-19 DIAGNOSIS — D2262 Melanocytic nevi of left upper limb, including shoulder: Secondary | ICD-10-CM | POA: Diagnosis not present

## 2024-11-21 DIAGNOSIS — M79605 Pain in left leg: Secondary | ICD-10-CM | POA: Diagnosis not present

## 2024-11-21 DIAGNOSIS — S86812A Strain of other muscle(s) and tendon(s) at lower leg level, left leg, initial encounter: Secondary | ICD-10-CM | POA: Diagnosis not present

## 2024-12-13 ENCOUNTER — Telehealth: Payer: Self-pay | Admitting: Family Medicine

## 2024-12-13 NOTE — Telephone Encounter (Signed)
 [employer] faxed FMLA forms to be completed and signed.  Form Fee Paid? (Y/N)       y    If NO, form is placed on front office manager desk to hold until payment received. If YES, then form will be placed in the RX/HH Nurse Coordinators box for completion.  Form will not be processed until payment is received

## 2024-12-27 ENCOUNTER — Encounter: Payer: Self-pay | Admitting: Family Medicine

## 2024-12-27 ENCOUNTER — Ambulatory Visit: Admitting: Family Medicine

## 2024-12-27 VITALS — BP 118/73 | HR 116 | Temp 100.8°F | Ht 63.75 in | Wt 183.6 lb

## 2024-12-27 DIAGNOSIS — F331 Major depressive disorder, recurrent, moderate: Secondary | ICD-10-CM | POA: Diagnosis not present

## 2024-12-27 DIAGNOSIS — F411 Generalized anxiety disorder: Secondary | ICD-10-CM | POA: Diagnosis not present

## 2024-12-27 DIAGNOSIS — J101 Influenza due to other identified influenza virus with other respiratory manifestations: Secondary | ICD-10-CM | POA: Diagnosis not present

## 2024-12-27 LAB — VERITOR SARS-COV-2 AND FLU A+B
BD Veritor SARS-CoV-2 Ag: NEGATIVE
Influenza A: POSITIVE — AB
Influenza B: NEGATIVE

## 2024-12-27 MED ORDER — CHLORPHEN-PE-ACETAMINOPHEN 4-10-325 MG PO TABS: 1.0000 | ORAL_TABLET | Freq: Four times a day (QID) | ORAL | 0 refills | Status: AC | PRN

## 2024-12-27 MED ORDER — OSELTAMIVIR PHOSPHATE 75 MG PO CAPS: 75.0000 mg | ORAL_CAPSULE | Freq: Two times a day (BID) | ORAL | 0 refills | Status: AC

## 2024-12-27 NOTE — Progress Notes (Signed)
 "  Acute Office Visit  Subjective:     Patient ID: Jean Campbell, female    DOB: May 29, 1991, 34 y.o.   MRN: [id number]  Chief Complaint  Patient presents with   Fever    HPI  History of Present Illness   Jean Campbell is a 34 year old female who presents with flu symptoms.  Constitutional symptoms - Onset of symptoms Wednesday after exposure to individuals with influenza - Sweats and chills at work without fever - Body aches and significant fatigue since Wednesday - Poor sleep quality since symptom onset  Upper respiratory symptoms - Sore throat at night due to postnasal drainage, currently absent - Uses sore throat spray without relief - Sinus pressure headaches - Ear pain, especially when blowing nose, attributed to congestion  Gastrointestinal symptoms - No nausea, vomiting, or diarrhea - Mild nausea with minor burping  Lower respiratory symptoms - Chest pain, especially with coughing and breathing - Symptoms worsen at night - Wheezing and episodes of waking up choking on mucus - Sleeps upright to alleviate symptoms - No history of asthma  Psychosocial stressors and coping - Ongoing anxiety and depression symptoms. Typically has moderate symptoms but does have exacerbations depending on life circumstances where she struggles to complete household tasks and her ability to be productive is decreased with increased fatigue symptoms. - Intermittent feelings of being overwhelmed with life and work - Would like to discuss intermittent FMLA. Sometimes has to miss work due to increase symptoms, typically one absence per month for a few days - Started counseling every other Thursday and finds it beneficial - Remains physically active, participating in marathons and 5Ks to help manage anxiety and depression symptoms - Completed six marathons last year and plans to participate in a 5K next Saturday           12/27/2024    8:51 AM 07/05/2024    9:24 AM 11/23/2023   11:11  AM  Depression screen PHQ 2/9  Decreased Interest 2 0 0  Down, Depressed, Hopeless 1 0 0  PHQ - 2 Score 3 0 0  Altered sleeping 2 0 0  Tired, decreased energy 3 0 0  Change in appetite 0 0 0  Feeling bad or failure about yourself  1 0 0  Trouble concentrating 0 0 0  Moving slowly or fidgety/restless 0 0 0  Suicidal thoughts 0 0 0  PHQ-9 Score 9 0  0   Difficult doing work/chores Somewhat difficult Not difficult at all Not difficult at all     Data saved with a previous flowsheet row definition      12/27/2024    8:51 AM 07/05/2024    9:25 AM 11/23/2023   11:11 AM 11/16/2023   10:32 AM  GAD 7 : Generalized Anxiety Score  Nervous, Anxious, on Edge 0 0  0  0   Control/stop worrying 2 0  0  0   Worry too much - different things 2 0  0  0   Trouble relaxing 0 0  0  0   Restless 0 0  0  0   Easily annoyed or irritable 2 0  0  0   Afraid - awful might happen 0 0  0  0   Total GAD 7 Score 6 0 0 0  Anxiety Difficulty Somewhat difficult Not difficult at all Not difficult at all Not difficult at all     Data saved with a previous flowsheet row definition  ROS As per HPI.      Objective:    BP 118/73   Pulse (!) 116   Temp (!) 100.8 F (38.2 C) (Temporal)   Ht 5' 3.75 (1.619 m)   Wt 183 lb 9.6 oz (83.3 kg)   SpO2 97%   BMI 31.76 kg/m    Physical Exam Vitals and nursing note reviewed.  Constitutional:      General: She is not in acute distress.    Appearance: She is ill-appearing. She is not toxic-appearing or diaphoretic.  HENT:     Head: Normocephalic.     Right Ear: Tympanic membrane, ear canal and external ear normal.     Left Ear: Tympanic membrane, ear canal and external ear normal.     Nose: Congestion present.     Mouth/Throat:     Mouth: Mucous membranes are moist.     Pharynx: Posterior oropharyngeal erythema present. No pharyngeal swelling, oropharyngeal exudate, uvula swelling or postnasal drip.     Tonsils: No tonsillar exudate or tonsillar  abscesses. 1+ on the right. 1+ on the left.  Eyes:     General:        Right eye: No discharge.        Left eye: No discharge.     Conjunctiva/sclera: Conjunctivae normal.  Cardiovascular:     Rate and Rhythm: Normal rate and regular rhythm.     Heart sounds: Normal heart sounds. No murmur heard. Pulmonary:     Effort: Pulmonary effort is normal. No respiratory distress.     Breath sounds: Normal breath sounds. No wheezing.  Abdominal:     General: Bowel sounds are normal. There is no distension.     Palpations: Abdomen is soft.     Tenderness: There is no abdominal tenderness.  Musculoskeletal:     Right lower leg: No edema.     Left lower leg: No edema.  Lymphadenopathy:     Cervical: No cervical adenopathy.  Skin:    General: Skin is warm and dry.  Neurological:     General: No focal deficit present.     Mental Status: She is alert and oriented to person, place, and time.  Psychiatric:        Mood and Affect: Mood normal.        Behavior: Behavior normal.     No results found for any visits on 12/27/24.      Assessment & Plan:   Aurore was seen today for fever.  Diagnoses and all orders for this visit:  Influenza A -     Veritor SARS-CoV-2 and Flu A+B -     Chlorphen-PE-Acetaminophen  4-10-325 MG TABS; Take 1 tablet by mouth every 6 (six) hours as needed. -     oseltamivir (TAMIFLU) 75 MG capsule; Take 1 capsule (75 mg total) by mouth 2 (two) times daily for 5 days.  Moderate episode of recurrent major depressive disorder (HCC)  GAD (generalized anxiety disorder)   Assessment and Plan    Influenza A Acute Influenza A with moderate symptoms. Day two of illness. Expected improvement in a few days, full recovery may take weeks. - Prescribed Tamiflu, advised immediate start. - Prescribed Norel AD for decongestion and fever. - Recommended Advil or ibuprofen for pain and fever. - Advised cool mist humidifier and hot steamy showers. - Encouraged rest and gradual  activity resumption.  Depression and anxiety Ongoing management with counseling and lifestyle modifications. - Continue counseling every other Thursday. - Encouraged exercise and vitamin D  supplementation. - Will complete intermittent FMLA paperwork to allow for 1 absence per month for up to 3 days as well as cover for biweekly therapy appointments if needed.      Return in about 6 months (around 06/26/2025) for chronic follow up.  The patient indicates understanding of these issues and agrees with the plan.  Annabella CHRISTELLA Search, FNP   "

## 2024-12-30 ENCOUNTER — Telehealth

## 2024-12-30 ENCOUNTER — Encounter: Payer: Self-pay | Admitting: Family Medicine

## 2024-12-30 ENCOUNTER — Ambulatory Visit: Payer: Self-pay

## 2024-12-30 NOTE — Telephone Encounter (Signed)
 FYI Only or Action Required?: FYI only for provider: appointment scheduled on 1/26 virtual UC.  Patient was last seen in primary care on 12/27/2024 by Joesph Annabella HERO, FNP.  Called Nurse Triage reporting Influenza.  Symptoms began a week ago.  Interventions attempted: Prescription medications:  Chlorphen-PE-Acetaminophen , Tamiflu .  Symptoms are: gradually worsening.  Triage Disposition: See Physician Within 24 Hours (overriding Call PCP Within 24 Hours)  Patient/caregiver understands and will follow disposition?: Yes   Tested positive for flu last week in office on 1/23. Prescribed Chlorphen-PE-Acetaminophen  and tamiflu . Almost done with tamiflu . Symptoms have improved but reports persistent severe cough with green mucus. Wanting cough syrup prescribed before going back to tomorrow. No SOB. Some chest tightness. No fever. Home office closed today d/t winter weather. Virtual UC appt scheduled today. Advised UC or ED for worsening symptoms.    Message from [place] B sent at 12/30/2024 11:30 AM EST  Reason for Triage: patient was positive for flu a last week and patient is still having a bad cough and would like a rx for cough   Reason for Disposition  [1] Patient is NOT HIGH RISK AND [2] strongly requests antiviral medicine AND [3] flu symptoms present < 48 hours    Strongly requests a cough medicine  Answer Assessment - Initial Assessment Questions 1. DIAGNOSIS CONFIRMATION: When was the influenza diagnosed? By whom? Did you get a test for it?     1/23  2. INFLUENZA MEDICINES: Were you prescribed any medicines for the influenza?  (e.g., zanamivir [Relenza], oseltamivir  [Tamiflu ]).      Tamiflu   3. SYMPTOMS: What is your main symptom or concern? (e.g., cough, fever, shortness of breath, muscle aches)     Cough, nasal congestion  4. ONSET: When did the symptoms start?      1 week ago  5. COUGH: Do you have a cough? If Yes, ask: How bad is the cough?       Yes,  severe. Green.   6. FEVER: Do you have a fever? If Yes, ask: What is your temperature, how was it measured, and when did it start?     Denies  7. BREATHING DIFFICULTY: Are you having any difficulty breathing? (e.g., normal; shortness of breath, wheezing, unable to speak)      No  8. PREGNANCY: Is there any chance you are pregnant? When was your last menstrual period?     Denies. On currently.   9. HIGH RISK FOR COMPLICATIONS: Do you have any chronic medical problems? (e.g., asthma, heart or lung disease, obesity, weak immune system)     Denies  Protocols used: Influenza (Flu) Follow-up Call-[facility]

## 2024-12-31 NOTE — Telephone Encounter (Signed)
 Noted.

## 2025-01-06 ENCOUNTER — Ambulatory Visit: Payer: Self-pay | Admitting: Family Medicine
